# Patient Record
Sex: Female | Born: 1935 | Race: Black or African American
Health system: Western US, Academic
[De-identification: ages and names within clinical notes are randomized; demographics above are authoritative.]

## PROBLEM LIST (undated history)

## (undated) DIAGNOSIS — I10 Essential (primary) hypertension: Secondary | ICD-10-CM

## (undated) DIAGNOSIS — M069 Rheumatoid arthritis, unspecified: Secondary | ICD-10-CM

## (undated) DIAGNOSIS — N289 Disorder of kidney and ureter, unspecified: Secondary | ICD-10-CM

## (undated) DIAGNOSIS — J45909 Unspecified asthma, uncomplicated: Secondary | ICD-10-CM

## (undated) HISTORY — DX: Unspecified asthma, uncomplicated: J45.909

## (undated) HISTORY — PX: TRANSPLANT, KIDNEY: SHX900003

## (undated) HISTORY — DX: Rheumatoid arthritis, unspecified: M06.9

## (undated) HISTORY — DX: Essential (primary) hypertension: I10

## (undated) HISTORY — DX: Disorder of kidney and ureter, unspecified: N28.9

---

## 2002-07-11 ENCOUNTER — Ambulatory Visit

## 2002-07-14 ENCOUNTER — Other Ambulatory Visit: Payer: Self-pay | Admitting: GASTROENTEROLOGY

## 2002-07-14 ENCOUNTER — Encounter: Admitting: GASTROENTEROLOGY

## 2004-09-16 ENCOUNTER — Ambulatory Visit

## 2004-09-17 ENCOUNTER — Encounter: Admitting: Nephrology

## 2004-10-29 ENCOUNTER — Other Ambulatory Visit: Payer: Self-pay | Admitting: Nephrology

## 2004-10-29 ENCOUNTER — Encounter

## 2004-11-05 NOTE — Progress Notes (Addendum)
October 29, 2004 RE: Cathy Hunter    MR#: [id number]    DOB: Jun 05, 1925    Date of Service: 10/29/2004       Lonell Face, MD  [facility]  [address]  [place], [place] [address]        Dear Dr. Hilda Lias:    Thank you for this kind referral to the [facility].    We had the pleasure of seeing your patient, Cathy Hunter, in new consultation for renal transplant.    Her past medical history is as follows:    1. End-stage renal disease secondary to diabetes. The patient currently dialyzes Tuesday, Thursday and Saturday for 3-1/2 hours. She has residual urine output of about 500 cc per day. The patient has had no previous transplants or transfusions. She's G6, P6.    2. Diabetes, type 2. This was diagnosed in 2000 and is diet-controlled.    3. Hypertension for the last 15 years.    4. The patient was diagnosed as having a pancreatic mass at [facility] in 2002. It was felt at that time that the patient had pancreatic cancer. She was very ill at that time and had lost nearly 100 pounds of weight. It was felt she might not survive. She was, however, transferred to [facility] and it was determined that her pancreatic mass was most likely benign and that she had a very good prognosis for survival. A biopsy was subsequently taken of the sonolucent lesion in the head of the pancreas, and it was found not to have any atypical cells.    Past surgical history:    1. Left arteriovenous fistula.    2. Total abdominal hysterectomy.    3. Right total-hip replacement.    4. Open cholecystectomy.    5. Right-knee arthroscopy.    6. Right-shoulder arthroscopy.    7. Wrist surgery with pinning, left wrist.      The patient lives alone in [place]. 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 She has a previous history of smoking but quit 30 years ago. She also quit drinking 30 years  ago. The patient has no history of illicit drug use.    Family history is significant for brother who was diagnosed with renal failure in his 30s and died secondary to complications of that. She also had a son with glomerulonephritis and was status post kidney transplant times two. He died in a car accident not too long ago.    She has allergies to CODEINE, PENICILLIN and TALWIN. Her medications are not known.    On review of systems, the patient says she keeps very active. She drives to her dialysis sessions and does all her activities of daily living. The patient attends an exercise class where she stretches and does something similar to yoga. The patient walks a few blocks every day. She says she has an excellent energy level. She has a very good appetite. Her sleep is restful. She has no chest pain and no orthopnea. No palpitations and no lightheadedness. No shortness of breath, no hemoptysis, no melena and no bright red blood per rectum. She has no history of urinary tract infections. There is no history of stroke, seizures or loss of consciousness. The patient has had chickenpox in the past but has not had shingles.    Physical examination  reveals her BMI is 30.5, weight 170, height 62.5 inches, blood pressure 152/79 and heart rate 85. The patient has bilateral hearing aids in place. She has no carotid bruits. Her breasts are without masses. Lungs are clear to auscultation. Heart has a regular rate and rhythm. She has a large abdominal scar. Her pulses are +2 bilaterally, femoral, popliteal and dorsalis pedis. The patient has numerous moles that are papular on her back and on her stomach. She has a left upper extremity fistula in place.    Studies: She is blood type A positive. Her PRA was zero percent in August 2005. An esophagogastroduodenoscopy in September 2004 was normal. This was done to work up her nausea and vomiting. A chest x-ray in March 2006 was negative. Electrocardiogram showed normal sinus  rhythm, left ventricular hypertrophy, normal P axis. Persantine thallium in December 2005 with no ischemia and an ejection fraction of 63%. A mammogram done in March 2006 was benign. Colonoscopy has not yet been done; the patient was previously very scared and hesitant to have this done due to the severe complications she had in 2004 secondary to a barium swallow done to evaluate her nausea and vomiting. Abdominal ultrasound done in July 2004, as previously mentioned, showed a 2.5-cm sonolucent lesion in the head of the pancreas. Biopsy was done for that and showed no atypical cells.    Assessment and plan: This is a 71 year old female who has end-stage renal disease secondary to diabetes who overall would be an appropriate candidate for renal transplant. Despite her advanced age, she is a very lively and energetic person with few overall comorbidities.    The patient will need to have the following workup done to complete her file:  1.colonoscopy/ flexible sigmoidoscopy  2.echocardiogram.    The Transplant Selection Committee will be meeting next week to make final recommendations. If they should differ from those mentioned above, the patient's renal case manager will be contacted.    Again, thank you for this kind referral to the [facility]. If you have any additional questions, please do not hesitate to call.    Sincerely yours,      Pryor Montes, MD  ASSISTANT PROFESSOR  DIVISION OF NEPHROLOGY  DEPARTMENT OF INTERNAL MEDICINE  THIS WAS ELECTRONICALLY SIGNED - 11/05/2004 12:31 PM PST BY:     HQ:[id number]    D: 10/29/2004 06:17 PM  T: 10/30/2004 11:26 AM  C#: [id number]

## 2004-12-18 NOTE — Consults (Addendum)
PATIENT: Cathy, Hunter LOCATIONJules Schick   MR #: [id number] SEX: F AGE: 71  DATE OF CONSULTATION: 10/29/2004 DOB: 1925/03/30    TRANSPLANT CENTER PSYCHOSOCIAL EVALUATION     IDENTIFICATION AND REFERRAL STATEMENT:    Cathy Hunter is a 71 year old African American female with end-stage renal disease being evaluated for a renal transplant and referred for psychosocial evaluation by Pryor Montes, MD.    PSYCHOSOCIAL ASSESSMENT:    Cathy Hunter was born on [birth year] in [place], [place], where she was raised. She is the second of two children. Both her parents are now deceased. Her mother worked as a Designer, jewellery while she was growing up, and her father was the first Insurance account manager in [place]. She describes her relationship with both her mother and father as very good. Her education level is a Probation officer and she also is a Designer, jewellery. Of note, her brother died of renal failure, as well.    The patient is a widow. She was married for 23 years. Her husband died five years ago from a heart attack. She describes her marriage as good and that her husband was a good husband. She was previously married for 29 years, and her ex-husband is still alive and lives in [place]. She has six children, all of whom are involved in the patient's care except for one son who passed away from a car accident five years ago. Coincidentally he did have end-stage renal disease as well, and had a successful transplant.    The patient lives alone in a condominium in [place]. She has lived here for three years, moving from [facility] [place]. All of her children and grandchildren live out of the area; however, visit often and are very involved in her care. One daughter does live locally; however, she is moving to [place] very soon.    Currently the patient is very independent. She drives and meets all her own activities of daily living. She reports having a strong support  system including her family, who is very involved in her care, but also lots of friends who live within the condominium complex and from her church. The patient reports a very strong church involvement and feels that her faith is very helpful in her coping.    Cathy Hunter is retired. 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 She was a Designer, jewellery prior to that. Her financial situation is stable. She has her own pension and Tree surgeon, plus her husband's pension. She has [employer].    The patient is a nonsmoker. She denies any current or past issues with illicit drug use, alcohol use, or any issues with prescription pain medication. She did take Vicodin recently due to back pain from too much calcium for about three weeks.    The patient denies any current or past history with any psychiatric issues. She has never taken medication or received treatment for anxiety, depression, or any other mental health issues. She is not aware of any family history of mental health problems. She describes her coping strategies as keeping a positive attitude, talking with family and friends, and her faith all play a role in her coping abilities.    Cathy Hunter presented today with a casual and well-groomed appearance to this evaluation. Her attitude toward the interviewer was frank and attentive. Psychomotor activity was unremarkable. Her mood was  pleasant and her affect broad. Her speech was spontaneous. Thought process, thought content and judgment all appear to be within normal limits. She appears to have a great understanding about her illness and about transplantation.    RECOMMENDATION:    Cathy Hunter appears to be an acceptable candidate for kidney transplantation. In spite of living alone, she has a very strong support system. She has no history of substance use or mental health issues. She appears to be  demonstrating very good compliance with her dialysis.      THIS WAS ELECTRONICALLY SIGNED - 12/18/2004 11:34 AM PST BY: Shella Spearing, LCSW  LICENSED CLINICAL SOCIAL WORKER    ZOX:[id number])  D: 12/16/2004 09:25 AM  T: 12/17/2004 02:27 PM  C#: [id number]    cc:   Pryor Montes, MD  Division of Transplant Nephrology  [facility]

## 2005-08-31 ENCOUNTER — Inpatient Hospital Stay: Admission: RE | Admit: 2005-08-31 | Attending: Transplant Surgery | Admitting: Transplant Surgery

## 2005-09-01 NOTE — Consults (Signed)
PATIENT: Cathy Hunter, Cathy Hunter LOCATION:   MR #: [id number] SEX: F AGE: 72  DATE OF SERVICE: 09/01/2005 DOB: [birth year]    TRANSPLANT NEPHROLOGY INITIAL CONSULTATION     Patient was admitted on August 27th and was seen at the request of Dr. Rise Patience of the Transplant Surgery Service for assessment of suitability for deceased donor renal transplant and immunosuppression status post renal transplant. She is a 72 year old African-American female with stage V chronic kidney disease thought due to diabetic nephropathy admitted in anticipation of a deceased donor renal transplant. She is CMV seropositiveand has a 0% panel-reactive Ab.    PAST MEDICAL HISTORY:    1. Stage V chronic kidney disease on maintenance hemodialysis, Monday/Wednesday/Friday for 3 hours, with estimated dry weight 67 kg and 2 to 3 kg intradialytic weight gains. She produces approximately 700 mL of urine per day as measured by 24-hour collection. 2. Adult onset diabetes diagnosed in 2000 and is diet-controlled with blood sugars in the 80 to 100 range.  3. Hypertension for 15 years.  4. Status post placement of right forearm arteriovenous graft. 5. History of benign pancreatic mass in 2002 which has been stable and asymptomatic since.  6. Status post placement of left arteriovenous fistula which did not develop.  7. Status post total abdominal hysterectomy  8. Status post right total hip replacement.  9. Status post open cholecystectomy.  10. Status post right knee arthroscopy.  11. Status post right shoulder arthroscopy.  12. Status post left wrist surgery with pinning.  13. Clostridium difficile pseudomembranous colitis approximately 6 months ago due to antibiotic therapy.    ALLERGIES: MEDICATIONS: SHE IS ALLERGIC TO CODEINE, PENICILLIN AND TALWIN. Medications on Admission: Folic acid 1 mg daily, lovastatin 10 mg daily, aspirin 81 mg daily.    FAMILY HISTORY:    Brother died in his 33s with chronic kidney disease. A son with glomerulonephritis received  kidney transplant.    SOCIAL HISTORY:    She lives alone. She denies tobacco, alcohol, recreational or drug use. She did smoke 30 years ago.    REVIEW OF SYSTEMS:    Constitutional: No chronic fevers, chills or sweats. No malaise. No anorexia. Good energy level. She drives to dialysis sessions and attends regular exercise classes. She walks a few blocks every day. Eyes: Stable vision with correction. ENT: No sinus, ear or dental complaints. Pulmonary: No chronic cough, wheezing, shortness of breath, or sputum productions. Cardiovascular: No chest pain, palpitations, PND, orthopnea, or edema. GI: No chronic nausea, vomiting, diarrhea, hematemesis, melena or hematochezia. GU: No urinary urgency, frequency, dysuria, gross hematuria, urinary tract infection or nephrolithiasis. Skin: No rash or itching. Neurologic: No strokes, seizure, dizziness, lightheadedness, loss of consciousness, numbness or weakness.    PHYSICAL EXAMINATION:    GENERAL: Reveals an elderly female looking younger than stated age, lying comfortably in bed.    WEIGHT: 65.1 kg.    VITAL SIGNS: Temperature 36.4, blood pressure 170/58, pulse 73. O2 sat 100% on room air.    HEENT: Pupils equal, round, reactive to light. Intact extraocular movements. Sclerae anicteric. Oropharynx is clear with moist mucous membranes.    NECK: No lymphadenopathy. No jugular venous distention; 2/2 carotid pulses without bruits. No thyromegaly or nodules.    LUNGS: Clear to auscultation.    HEART: Regular rhythm, S1, S2, no S3 or S4, murmur, rub or gallop.    ABDOMEN: Soft, nontender, nondistended, no mass, no organomegaly, no bruit. Normal bowel sounds. Well-healed midline incision and right subcostal incision.  EXTREMITIES: No peripheral edema, 2/2 dorsalis pedis and femoral pulses bilaterally. Right arm: Arteriovenous graft intact with thrill and bruit.    SKIN: Xerosis. No rash, petechiae or purpura.    NEUROLOGIC: Cranial nerves II through XI intact. Motor strength 5/5  throughout. Sensory intact to light touch; no tremor.    LABORATORY DATA:    EKG: Normal sinus rhythm with LVH. BUN and creatinine 25 and 6.9. Potassium 4, bicarbonate 28, phosphorous 4.8, albumin 3.5. Liver function tests within normal limits. White count 4.7, hemoglobin 15.1, platelets 158,000.    Chest x-ray: No acute disease. Persantine thallium study December 2005, LVEF 63%, no ischemia. Mammogram March 2006 was benign.    IMPRESSION:    This is a 72 year old female with stage V CKD due to presumed diabetic nephropathy who has no contraindication to transplant. She was dialyzed yesterday and is euvolemic with appropriate electrolytes. Recommend:    1. Consider decreased induction immunosuppression with Zenapax 2 mg/kg IV and initiation of tacrolimus and CellCept. 2. Infectious disease prophylaxis with IV ganciclovir followed by valganciclovir dose suggested for renal function as soon as she is adequately taking POs. Continue Bactrim for Pneumocystis pneumonia prophylaxis.  3. No indication for preoperative dialysis.  4. She may require insulin therapy after transplant.              THIS WAS ELECTRONICALLY SIGNED - 09/01/2005 7:21 PM PST BY: Sabino Gasser, MD, PHD  ASSOCIATE PROFESSOR  DIVISION OF NEPHROLOGY & TRANSPLANT  DEPARTMENT OF INTERNAL MEDICINE      ZOX:[id number])    D: 09/01/2005 11:23 AM  T: 09/01/2005 05:18 PM  C#: [id number]

## 2005-09-01 NOTE — H&P (Addendum)
PATIENT: Cathy Hunter, BOONSTRA LOCATION:   MR #: [id number] SEX: F AGE: 72  ADMISSION DATE: 08/31/2005 DOB: Jan 11, 1925    INPATIENT HISTORY AND PHYSICAL    CHIEF COMPLAINT: Kidney failure.    HISTORY OF PRESENT ILLNESS:    [name] is a 72 year old woman who first presented for consultation for renal transplant in October of 2006. She has end-stage renal disease secondary to diabetes and has been receiving hemodialysis on Tuesday, Thursday and Saturday for approximately five years. She has residual urine output of about 500 cc per day, and she has no previous transplants or transfusions. She was initially evaluated by our Transplant Nephrology Team on October 29, 2004, and since then she reports that she has had no changes in her health. Upon presentation today, she denies any pain. Today, she returned from a vacation with her daughter, went immediately to hemodialysis upon arriving home, and then was notified of the potential for renal transplant this evening and was admitted to the hospital. This patient has been found to be HIV negative, hep B negative, CMV positive, RPR negative, VZV positive, EBV positive, hep C negative.    PAST MEDICAL HISTORY:    1) End-stage renal disease secondary to diabetes, presently dialyzed through a right upper extremity graft on Tuesday, Thursday, and Saturday. 2) Diabetes type 2, diet controlled. 3) Hypertension. 4) Benign pancreatic mass. PAST SURGICAL HISTORY: Left AV fistula, total abdominal hysterectomy, right total hip replacement, open cholecystectomy, right knee arthroscopy, right shoulder arthroscopy, left wrist surgery with pinning, and right upper extremity vascular graft. ALLERGIES: PENICILLIN, CODEINE, PENTAZOCINE, IODINE, AZITHROMYCIN, SULFUR, VICODIN. Current Home Medications: Clonazepam 0.5 mg 30 minutes prior to hemodialysis, Fosrenol 1000 mg p.o. t.i.d. with meals and 500 mg with snacks, oxazepam one tab before hemodialysis for cramps, metoprolol 25 mg b.i.d., fluoxetine  10 mg daily, Sensipar 30 mg daily with dinner, lovastatin 20 mg daily, lisinopril 10 mg daily.    FAMILY HISTORY:    Notable for kidney failure in a brother and glomerulonephritis in a son, who later passed away in a car accident.    SOCIAL HISTORY:    She quit smoking approximately 50 years ago. Prior to that she smoked for approximately 15 years. She smoked approximately one pack per week. She denies any alcohol use or other drug use. She currently lives alone in [place] and she is a very independent woman. She does have her daughter in the area for support.    REVIEW OF SYSTEMS:    An 11-point review of systems is negative for fevers, pain, nausea, vomiting, diarrhea, dizziness, weakness, and shortness of breath.    PHYSICAL EXAMINATION:    VITALS: Heart rate 73, blood pressure 170/58, respirations 20, saturations 100% on room air.    IN GENERAL: This is a well-appearing and very affable older woman in no acute distress.    HEENT: Normocephalic, atraumatic. Mucous membranes moist. Oropharynx clear without erythema or exudate.    NECK: Supple, no lymphadenopathy.    PULMONARY: Clear to auscultation bilaterally.    CARDIOVASCULAR: Regular rate and rhythm. Peripheral pulses equal and intact bilaterally.    ABDOMEN: Well-healed midline scar in her hypogastrium. Well-healed Kocher incision in the right subcostal area. Soft, nontender, nondistended. Normal bowel sounds. No hepatosplenomegaly.    EXTREMITIES: Full range of motion in all four extremities. No clubbing, cyanosis or edema. RUE AVF intact with good thrill. Evidence of old fistula on LUE.    NEURO: Alert and oriented times four, appropriate affect.  STUDIES:    A twelve lead EKG shows normal sinus rhythm with left ventricular hypertrophy and slightly prolonged QT.    Labs today show a potassium of 4.0, creatinine of 6.9 post hemodialysis, glucose 78.    LFTs within normal limits.    PTT 27.7, INR 1.09.    White blood count 4.7, hemoglobin 15.1, hematocrit  46.2.    UA and chest x-ray are pending at this time.    IMPRESSION:    [name] is a 72 year old woman with end-stage renal disease secondary to diabetes, now on hemodialysis for approximately five years. She presents today for deceased donor renal transplant and will undergo preoperative evaluation to include labs, EKG, and chest x-ray before proceeding to the OR this evening for transplant.    INFORMED CONSENT:    She has been consented for surgery and all her questions have been answered. She is NPO currently and we are waiting final results of her preoperative evaluation.          THIS WAS ELECTRONICALLY SIGNED - 09/10/2005 5:50 PM PST BY: Vernice Jefferson, MD    THIS WAS ELECTRONICALLY SIGNED - 09/01/2005 8:21 PM PST BY: Boykin Peek, MD  RESIDENT  DEPARTMENT OF SURGERY      BM:[id number])    D: 08/31/2005 09:42 PM  T: 09/01/2005 03:45 AM  C#: [id number]

## 2005-09-02 NOTE — Progress Notes (Signed)
PATIENT: Cathy Hunter, Cathy Hunter LOCATION: [id number]  MR #: [id number] SEX: F AGE: 72  DATE OF SERVICE: 09/02/2005 DOB: 08-04-1925    INTERNAL MEDICINE INPATIENT PROGRESS NOTE    Patient was seen with nephrology fellow, Dr. Mercy Moore. I repeated the full physical exam and key parts and verified the history. I agreed with his assessment and together we developed a care plan. This morning, Cathy Hunter is in very jovial spirits. She denies any shortness of breath. She denies any chest discomfort, any nausea or vomiting. Patient has not passed gas yet. She has slight discomfort in her abdomen secondary to this. The rest of her review of systems are negative.  PHYSICAL EXAMINATION: She has had a total of six liters of fluid in with 600 cc of urine output. Her blood pressure has remained in the 150 to 170 range over 60 to 70, heart rate in the 80s. Temperature: T max was 98.4. Her blood glucose level this morning was 71. She is an elderly African-American female who looks to be in no acute distress. She is nontoxic looking. Her pupils are equal, round, and reactive to light. They are nonicteric. Her lungs are clear to auscultation. Her heart is regular rate and rhythm. Her abdomen is soft. Slight pain at the incision site. She has 1+ lower extremity edema. She has bilateral hearing aids in place.  MEDICATIONS: Currently taking lanthanum 500 mg t.i.d., ganciclovir 150 mg q. day, lovastatin 20 mg q. day, amlodipine 5 mg q. day, metoprolol 25 mg b.i.d., dapsone 100 mg q. day, CellCept 500 mg b.i.d., famotidine 20 mg b.i.d., Solu-Medrol 125 mg, and gammaglobulin 75 mg.  LABS: Creatinine 6.6, BUN 39, potassium 4.8, white count 7.2, hemoglobin 10.6, platelet count 150, phos 7.8, calcium 6.6. IMPRESSION AND PLAN: 1. This is a 72 year old female who was status post a deceased donor renal transplant secondary to endstage renal disease from diabetic nephropathy. Patient has slow graft function likely secondary to that prolonged cold ischemia time  of approximately 25 hours. Her urine output has significantly increased today, and I think we will start to see a reduction in her creatinine for the next day or two. The patient has no overt uremic symptoms, and there is no urgency for hemodialysis for electrolytes or volume status. 2. Immunosuppression. Patient received induction therapy with two doses of Thymoglobulin and SoluMedrol. Patient should continue on CellCept 500 mg and depending on her serum creatinine tomorrow will determine when her Prograf dose should be started.  3. ID prophylaxis. She should continue on ganciclovir IV while Thymoglobulin is being given today, but the dose should be reduced to approximately 1.25 mg/kg, so a total dose of about 80 mg IV today. When she has completed her Thymoglobulin, she can be started on Valcyte 450 mg, which should be renally dosed to be given every 48 hours.  4. Hypertension. Patient's blood pressure is pretty elevated. I would increase her metoprolol 50 mg b.i.d., and I agree with adding Norvasc 5 mg which may need to be escalated.        THIS WAS ELECTRONICALLY SIGNED - 09/05/2005 11:46 PM PST BY: Pryor Montes, MD  ASSISTANT PROFESSOR  DIVISION OF NEPHROLOGY  DEPARTMENT OF INTERNAL MEDICINE                  AV:[id number]    D: 09/02/2005 07:52 PM  T: 09/02/2005 08:00 PM  C#: [id number]

## 2005-09-03 NOTE — Procedures (Signed)
PATIENT: Cathy Hunter, Cathy Hunter LOCATION: [facility]  MR #: [id number] SEX: F AGE: 72  OPERATION DATE: 09/01/2005 DOB: 03-28-1925    INPATIENT OPERATION RECORD    PREOPERATIVE DIAGNOSIS:     End-stage renal disease.    POSTOPERATIVE DIAGNOSIS:     Same.    OPERATIVE PROCEDURE:    1. Deceased donor renal transplant.  2. Back table preparation of renal allograft.  3. Back table venous anastomosis of renal allograft. 4. Back table arterial anastomosis of renal allograft. 5. Insertion of double-J ureteral stent.    CASE HISTORY:    This 72 year old female was admitted for deceased donor renal transplant. There were no medical contraindications for transplantation. The donor was a 72 year old diabetic who had received a living donor renal transplant eight years prior and died of subarachnoid hemorrhage. The transplanted allograft was procured en bloc and preserved with pulsatile preservation. Renal biopsies showed intact glomeruli with no evidence of chronic glomerulopathy or fibrosis. Pump characteristics showed flows of over 90 and resistive index of 0.24.    DETAILS OF PROCEDURE:     After induction of general anesthesia, the ABO blood type verification was confirmed. The Foley catheter was placed and a central line was placed by Anesthesiology. During this time, the back table preparation was performed.     The kidney was taken from off the pulsatile preservation and placed on ice. Nephric fat was excised. There was a laceration in the inferior pole of the kidney, which was closed with 5-0 Prolene and Dacron pledgets to oppose the parenchyma. The kidney had been procured intact with iliac artery and vein from the donor. The previously performed renal artery and renal vein anastomosis to the iliac vessels were intact.    The vessels were dissected free in order to lengthen the functional renal veins. Vascular staples were fired transversely across the iliac vein, right proximal and distal to the renal vein anastomosis. The new  venotomy was created in the iliac vein and served as the functional venous outflow to the allograft. There were multiple defects in the vein that were closed with 6-0 Prolene. The renal allograft artery was, likely, dissected free, and the arterial anastomosis to the iliac artery was intact.    The functional renal artery was lengthened by placing a staple across the distal end of the iliac artery. The proximal iliac artery was then used for the arterial inflow to the allograft. A kidney had been procured with a small patch of bladder, which included ureteroneocystostomy. The ureter was trimmed and the bladder patch was excised immediately proximal to the ureteral anastomosis.    The vessels were then checked for leaks and were found to be intact. The kidney was placed back in ice and our attention was then turned to the patient.    After appropriate surgical pause, and confirmation of consent and site of surgery, a left lower quadrant incision was made. The abdominal musculatures were divided, as were the round ligament and the inferior epigastric vessels. The retroperitoneal space was entered and common iliac artery and vein were dissected free.     The venous anastomosis was performed first to the external iliac vein in the side. Arterial anastomosis was then performed utilizing the previously mentioned donor arterial iliac artery, which was anastomosed end-to-side to the recipient external iliac artery. Clamps were released and the kidney pinked up gradually over a period of five minutes. Crossclamp time was 08/27, at 0717. The anastomosis began at 08/28, at 0824. Reperfusion was at 08/28,  at 0910.    Hemostasis was achieved with the cautery, and after approximately about 10 minutes there was a small amount of urine that was produced by the transplanted kidney.     The ureteral anastomosis was then performed via the standard  technique. A 6 x 16 double-J ureteral stent was placed across the ureteral  anastomosis, up into the renal pelvis and into the bladder. After the mucosal anastomosis was complete, the detrusor was closed over the tip of the ureter with a 3-0 Maxon suture.     A time-zero renal allograft biopsy was obtained with the Tru-Cut needle. Hemostasis was again confirmed, and the incision was closed with a running #1 PDS, followed by skin staples.    The patient was awakened and transported to the Recovery Room with the T-piece. There are no complications encountered. All sponge, needle and instrument counts were correct.    IV FLUIDS: 3800 cc crystalloid.  URINE OUTPUT: 70 cc.  ESTIMATED BLOOD LOSS: 300 cc.          THIS WAS ELECTRONICALLY SIGNED - 09/10/2005 5:49 PM PST BY: Vernice Jefferson, MD  SURGEON  DEPARTMENT OF SURGERY    JESSICA EMELIN, MD  ASSISTANT Erline Levine , MD  ASSISTANT SURGEON     ZOX:[id number])    D: 09/03/2005 02:24 AM  T: 09/03/2005 10:35 AM  C#: [id number]

## 2005-09-03 NOTE — Progress Notes (Signed)
PATIENT: Cathy Hunter, ZEE LOCATION: [facility]  MR #: [id number] SEX: F AGE: 72  DATE OF SERVICE: 09/03/2005 DOB: [birth year]    INTERNAL MEDICINE INPATIENT PROGRESS NOTE    Ms. Ayani Ospina was seen today with nephrology fellow, Dr. Mercy Moore. I repeated the full physical exam and verified the review of systems. I agree with his assessment, and together we developed the care plan. This morning, Ms. Baley has no complaints. She denies any shortness of breath, any abdominal pain; any fevers, chills, nausea, or vomiting. There rest of her review of systems is negative.  PHYSICAL EXAMINATION: Blood pressure has been 160 to 170 over 40 to 60, heart rate 70 to 80. CVP is 14. Is and Os: 6.2 L in and 2.8 L out. She is an African-American female, looks to be in no acute distress, just mild tenderness over the incision site with trace lower extremity edema. Otherwise, her physical exam is unchanged. LABS: Creatinine is 4 down from 6.6, potassium 5.2, bicarb 20. White count 6.6, hemoglobin 10.7, platelet count 125. Prograf trough is 2.0 ng/mL.  IMPRESSION AND PLAN: Ms. Wanetta Funderburke is a 72 year old female who is status post deceased donor renal transplant on 08/28, she is postop day #2 today, who has slow graft function secondary to prolonged cold ischemia time with slowly improving creatinine.  1. Immunosuppression. I would recommend discontinuing the Thymoglobulin, given her advanced age and risk for adverse reactions, and starting Prograf today of 2 mg b.i.d. and continuing CellCept. 2. ID/prophylaxis. If Thymoglobulin is discontinued, they can initiate Valcyte 450 mg which should be dosed every other day and dapsone for PCP prophylaxis.  3. Volume status. Patient has an elevated CVP with slight bibasilar crackles. I would recommend hep-locking additional fluids today. 4. Hypertension. Blood pressure is still elevated. Part of this may be volume mediated. Recommend increasing her Norvasc to 5 mg in am and 10mg  in pm.          THIS WAS  ELECTRONICALLY SIGNED - 09/06/2005 12:05 AM PST BY: Pryor Montes, MD  ASSISTANT PROFESSOR  DIVISION OF NEPHROLOGY  DEPARTMENT OF INTERNAL MEDICINE                  IO:[id number])    D: 09/03/2005 05:11 PM  T: 09/03/2005 05:20 PM  C#: [id number]

## 2005-09-04 NOTE — Progress Notes (Signed)
PATIENT: Cathy Hunter, Cathy Hunter LOCATION: [facility]  MR #: [id number] SEX: F AGE: 72  DATE OF SERVICE: 09/04/2005 DOB: [birth year]    INTERNAL MEDICINE INPATIENT PROGRESS NOTE    The patient, Cathy Hunter, was seen with the nephrology fellow, Dr. Mercy Moore. I repeated the full physical exam and verified the history and agree with the assessment. Together we developed a care plan. This morning Cathy Hunter has no complaints of shortness of breath, chest pain, abdominal discomfort, diarrhea, tremors. The rest of her review of systems is negative. When asked, however, this morning about her blood pressure medications at home, the patient says that she did not know and asked [place] to call the pharmacy at [facility]. We made numerous attempts to do so. The only medications that were listed as being taken were metoprolol 25 mg b.i.d.  PHYSICAL EXAM: Blood pressure 170 to 180 systolic over 70 to 80, heart rate between 80 to 110, T-max is 97.7. Had total of 1.8 L of fluid in, 2.1 L of urine output. A fasting blood sugar this morning was 133. She is an [place] American female who looks to be in no acute distress. Her physical exam is essentially unchanged from previously. Please see Dr. Karenann Cai notes for details. LABS: Creatinine 1.7, BUN 37 (that is down from 39 and 4.0), potassium 4.8. Phosphorus 3.4, magnesium 3.0. White count 5.7, hemoglobin 10.3, platelet count 151.  IMPRESSION AND PLAN: Cathy Hunter is a 72 year old female who is status post a deceased-donor renal transplant on September 01, 2005, which was five-antigen mismatch. Her allograft function has slowly, but steadily been improving.  1. Immunosuppression. She is status post induction therapy with two doses of Thymoglobulin, 100 mg and 75 mg IV, and Solu-Medrol. She should be maintained on CellCept 500 mg b.i.d. and Prograf to maintain a trough around 8 to 10 ng/mL.  2. Hypertension. Her blood pressure is quite elevated. I would recommend the following changes. I would recommend  discontinuing metoprolol and starting her on labetalol 300 mg b.i.d. I would continue the amlodipine that she is currently on as well. 3. Electrolytes. These are currently stable.  4. ID prophylaxis. Patient is at intermediate risk for developing CMV disease. She should be maintained on Valcyte 450 mg on Monday, Wednesday, and Friday given that her creatinine clearance is still around 25 mL/min. She should continue Dapsone for PCP prophylaxis. 5. Electrolytes. The patient's phosphorus level now is in the normal range. We can stop her Fosrenol and her PhosLo. Magnesium level is currently stable.        THIS WAS ELECTRONICALLY SIGNED - 09/06/2005 12:24 AM PST BY: Pryor Montes, MD  ASSISTANT PROFESSOR  DIVISION OF NEPHROLOGY  DEPARTMENT OF INTERNAL MEDICINE                  AV:[id number])    D: 09/04/2005 02:22 PM  T: 09/04/2005 03:45 PM  C#: [id number]

## 2005-09-05 NOTE — Progress Notes (Signed)
PATIENT: Cathy Hunter, BERREY LOCATION: [facility]  MR #: [id number] SEX: F AGE: 72  DATE OF SERVICE: 09/05/2005 DOB: [birth year]    INTERNAL MEDICINE INPATIENT PROGRESS NOTE    Ms. Shavonte Zhao was seen with the nephrology fellow, Sonda Rumble. I repeated the full physical exam, verified the history and I agree with his assessment. Together we developed a care plan. This morning, Ms. Parker has no complaints. She denies any shortness of breath, any abdominal pain, any difficulty swallowing, any diarrhea, or any tremors. She is noticing she is having slight leakage from her incision site. The rest of her review of systems is negative. PHYSICAL EXAM: Blood pressures have been better controlled in the 130 to 150 range over 60 to 70. Heart rate is in the 80s. Her T-max was 98.2 this morning. Her fasting blood sugar this morning was 103. She had a total of 1.8 L in and 1.1 L of urine output. Please see Dr. Collier Flowers note for details of her physical exam, but her exam has not changed. She is without edema, just very mild leakage of yellowish fluid from her incision site.  LABS: Creatinine is 1.8, down from 1.9, BUN is 43, her potassium is 3.9. White count 4.8, hemoglobin 9.4, platelet count 147, phos 3.3, mag is pending. Albumin 2.4.  IMPRESSION AND PLAN: 1. Ms. Amarya Kuehl is a very spry 72 year old who is postop day #4 from a deceased donor renal transplant. Her renal function is improving; however, the drop in her creatinine has slowed down.  2. Immunosuppression. Continue dual therapy with Prograf and CellCept. Her Prograf levels were low yesterday after two doses, and we will wait to see her Prograf trough this afternoon. I assume, however, it will still be low. Would recommend increasing her Prograf to 4 mg b.i.d. She should continue taking CellCept 500 mg b.i.d. 3. ID prophylaxis. Continue Valcyte Monday, Wednesday, Friday for the time being, given her suppressed renal function. If her creatinine drops more, she can be changed to Valcyte  450 mg daily. 4. Hypertension. Her blood pressure is better controlled on the combination of Norvasc and labetalol. If her systolic blood pressures remain persistently above 150 to 160, can titrate up labetalol to her heart rate.        THIS WAS ELECTRONICALLY SIGNED - 09/06/2005 12:31 AM PST BY: Pryor Montes, MD  ASSISTANT PROFESSOR  DIVISION OF NEPHROLOGY  DEPARTMENT OF INTERNAL MEDICINE                  MW:[id number])    D: 09/05/2005 10:18 A  T: 09/05/2005 10:27 A  C#: [id number]

## 2005-09-06 NOTE — Progress Notes (Signed)
PATIENT: Cathy Hunter, Cathy Hunter LOCATION: [facility]  MR #: [id number] SEX: F AGE: 72  DATE OF SERVICE: 09/06/2005 DOB: 1925-04-12    INTERNAL MEDICINE INPATIENT PROGRESS NOTE    I have repeated the full physical exam, and have verified his history, and I agree with his assessment. Together we developed a care plan. This morning Ms. Cathy Hunter has no complaints. She is tolerating her food fine. She has no nausea, no vomiting, no diarrhea. Patient reports that family members again cannot come today and will not be able to come until Tuesday. All of her family is in [facility] [place], and her Lobbyist is off for the weekend. Patient seems to think that she needs 24-hour, around-the-clock help before she can leave the hospital. Patient has also not made attempts to learn her medications. The rest of the review of systems is negative. PHYSICAL EXAMINATION: Blood pressure between 130 to 160 over 60 to 70, heart rate 74 to 88, T-max 97.6. Fasting blood sugar is 88. A total of 2 liters in and 800 cc of urine output. She is an [place]- American female. She looks to be in no acute distress. Heart is regular rate and rhythm. She has intermittent inspiratory wheezing. Her abdomen is soft; it is nontender, nondistended. She has 1-2+ edema in her lower extremities. Her neuro exam was nonfocal. MEDICATIONS: 1) CellCept 500 mg b.i.d. 2) Prograf 5 mg b.i.d. 3) Valcyte 450 mg Monday, Wednesday, Friday. 4) Colace 100 mg q. day. 5) Labetalol 300 mg b.i.d. 6) Tapasin 100 mg daily. 7) Norvasc 5 mcg in the morning, 10 in the evening. 8) Lovastatin. 9) Aspirin 81 mg. LABS: Creatinine 1.7, down from 1.8. Potassium 4.3, bicarb 21, mag 2.4, phos 3.0. White count 4.7, hemoglobin 9.4, platelet count 191. A fasting blood sugar was 88. Creatinine from the wound 1.89 mg/dL.    IMPRESSION AND PLAN: Ms. Cathy Hunter is a 72 year old female who is postop day number 5 from a deceased donor renal transplant with stable allograft function.  1.  Immunosuppression. Continue dual therapy with CellCept and Prograf. The Prograf dose was increased to 5 mg b.i.d. 2. Hypertension. She still remains hypertensive. Will increase labetalol to 400 mg b.i.d. and continue Norvasc.  3. ID prophylaxis. Continue Valcyte. Could change this dosing to 450 mg daily given improvement in creatinine and dapsone for PCP prophylaxis. G6PD is pending.  4. Social support. Will need to ensure that patient's family or caregiver will be supportive and consistent enough to bring her to her lab appointments, and to her clinic appointments.        THIS WAS ELECTRONICALLY SIGNED - 09/08/2005 9:39 AM PST BY: Pryor Montes, MD  ASSISTANT PROFESSOR  DIVISION OF NEPHROLOGY  DEPARTMENT OF INTERNAL MEDICINE        WG:[id number])    D: 09/06/2005 10:12 AM  T: 09/06/2005 12:02 PM  C#: [id number]

## 2005-09-07 NOTE — Progress Notes (Signed)
PATIENT: Cathy Hunter, Cathy Hunter LOCATION: [facility]  MR #: [id number] SEX: F AGE: 72  DATE OF SERVICE: 09/07/2005 DOB: September 11, 1925    INTERNAL MEDICINE INPATIENT PROGRESS NOTE    Cathy Hunter was seen with the nephrology fellow, Teodoro Spray. I repeated the full physical exam, verified the history, and I agree with his assessment. Together we developed a care plan. This morning, Cathy Hunter just had some pain at the incision site. She is also having just mild shortness of breath. She denies any nausea, any vomiting. She denies any difficulty urinating. Her p.o. intake has been at a minimal. Patient said she would like to have a Pepsi this morning. Otherwise appetite is good and the rest of her review of systems is negative.  PHYSICAL EXAM: Blood pressure has ranged between 140 to 150 over 50 to 60, heart rate has been in the 70s to 80 range. She has been afebrile. Her blood sugar this morning was 107. She had a total of 1.9 liters in and at 1.1 liters of urine output. African American female. She looks to be in no acute distress. Pupils are equal, round, and reactive to light. She has no thrush. She has no oral lesions. Heart: Regular rate and rhythm. Abdomen: Soft, nontender, nondistended. She does have some diffuse expiratory wheezing and she is getting slightly short of breath with talking. She has trace to 1+ lower extremity edema. Neuro exam: Nonfocal.  LABORATORY: Creatinine is up at 2.2, BUN is 45, potassium 5.7, bicarb 21, white count 5.3, hemoglobin 9, platelet count 153, albumin 2.3, phos 3.4, mag 2.5.  IMPRESSION AND PLAN: 1. Cathy Hunter is a 72 year old female who is postop day number 6 from a deceased donor renal transplant. Previously stable allograft function. Patient has an increase in her creatinine to 2.2., which I do not think can be explained by poor p.o. intake alone; however, patient will be hydrated and her serum creatinine will be rechecked this afternoon. It is a possibility that this may  represent rejection if truely elevated. Will repeat CHEM 7 in pm.  2. Immunosuppression status post two doses of Thymoglobulin for induction, and will be maintained on Prograf and CellCept. Patient was previously on 4 mg b.i.d. The patient has had an escalating dose of tacrolimus and is currently on 6 mg b.i.d. Given her small frame, given her weight of 65 kilograms, this would be an excessive dose and will likely result in significantly overshooting her Prograf trough of 8 to 10 ng/mL. We will await Prograf trough this afternoon. Continue CellCept 500 mg b.i.d.  3. Electrolytes. Currently stable.  4. Hypertension. This is better controlled since adding the labetalol. If her blood pressures remain in the high 150s, I would recommend an increase of labetalol to 500 mg b.i.d. 5. ID prophylaxis. The patient's creatinine is truly elevated. We may want to place her back on a Monday, Wednesday, Friday schedule of Valcyte. If the creatinine comes back down we can continue daily Valcyte. Also continue dapsone. G6PD is currently pending.    Addendum: afternoon creatinine elevated to 2.6 mg/dl despite hydration. Will check [place] with doppler to check for patency of vessels and if normal, should consider biopsy in am.     THIS WAS ELECTRONICALLY SIGNED - 09/08/2005 9:44 AM PST BY: Pryor Montes, MD  ASSISTANT PROFESSOR  DIVISION OF NEPHROLOGY  DEPARTMENT OF INTERNAL MEDICINE                  AV:[id number])  D: 09/07/2005 10:40 AM  T: 09/07/2005 10:53 AM  C#: [id number]

## 2005-09-08 NOTE — Progress Notes (Signed)
 PATIENT: Cathy Hunter, ION LOCATION:   MR #: [id number] SEX: F AGE: 72  DATE OF SERVICE: 09/08/2005 DOB: [birth year]    INTERNAL MEDICINE INPATIENT PROGRESS NOTE    TRANSPLANT NEPHROLOGY PROGRESS NOTE  Patient is seen and examined by me this morning.  SYMPTOM REVIEW: Overall feels well. No fever. No chills. No shortness of breath, although did have a mild episode yesterday. No orthopnea or PND. Urinary catheter removed yesterday and is able to void on her own. Mild lower extremity edema. Does not complain of significant volume overload, although she is up approximately 10 kilos above her dry weight.  PHYSICAL EXAM: Elderly, African-American female, in no apparent distress. Temperature 97, pulse 68-73 per minute, blood pressure 118- 133 over 50-53, respiratory rate 18-20 per minute. Blood sugars 90- 160 mg/dL. In's and Out's: 1.2 liters in and 450 cc out. Neck: No obvious jugular venous pressure that I could appreciate. Chest: Clear to auscultation bilaterally. Cardiac exam is normal. Abdomen is obese. Kidney transplant in the left lower quadrant is nontender. Extremity exam very trace peripheral edema.  LABORATORY DATA: Potassium 4.5, CO2 20, BUN 51, creatinine 3.0, blood glucose 78 mg/dL, serum phosphorus 4.2. White count 4.7, hematocrit 27, platelet count 173. Tacrolimus level yesterday was 8.3. ASSESSMENT: 1. Acute on chronic renal failure. The patient's serum creatinine has increased on a daily basis from 1.8 to 1.9 mg/dL, to 2.5, and now to 3 mg/dL. I think this is most likely secondary to ATN, but we should do a kidney transplant biopsy to make sure she does not have rejection.  2. Patient informed of the above. Very anxious because her son also had a kidney transplant and I think lost the kidney as a result of biopsy complication.  3. Immunosuppression. On dural therapy with Prograf and CellCept. Would not increase Prograf further. Aim for level of 8-12 ng/mL. 4. No indication for dialysis.  5. Electrolytes are  stable.  6. Reduce Valcyte to 450 mg three times a week.  7. Moderate degree of anemia but stable.  8. Kidney function likely to get worse before it gets better. We will plan kidney biopsy today. Results, hopefully, will be out this evening or tomorrow morning.           THIS WAS ELECTRONICALLY SIGNED - 09/10/2005 2:02 PM PST BY:  Marlane Mingle, MD  ASSISTANT PROFESSOR  DIVISION OF NEPHROLOGY & TRANSPLANT  DEPARTMENT OF INTERNAL MEDICINE                  WJX:[id number])    D: 09/08/2005 10:01 AM  T: 09/08/2005 10:44 AM  C#: [id number]

## 2005-09-09 ENCOUNTER — Encounter: Admitting: Nephrology

## 2005-09-09 NOTE — Progress Notes (Signed)
PATIENT: Cathy Hunter, Cathy Hunter LOCATION: [facility]  MR #: [id number] SEX: F AGE: 72  DATE OF SERVICE: 09/09/2005 DOB: 01/19/1925    INTERNAL MEDICINE INPATIENT PROGRESS NOTE    TRANSPLANT NEPHROLOGY PROGRESS NOTE  Shrinika Blatz was seen and examined by me this morning. SYSTEM REVIEW: Underwent kidney biopsy yesterday. No significant discomfort at the biopsy site. No gross hematuria. Hematocrit has remained stable. Did have a mild flare of her asthma at night. She was prescribed inhalers. She is on low oxygen at night. SYMPTOM: No fever. No chills. Denies shortness of breath. Denies chest pain. No abdominal pain, nausea, or vomiting. No dysuria or hematuria, although urine output is decreased. Mild but stable lower extremity edema.  PHYSICAL EXAM: Temperature 98, heart rate 65-75 per minute, blood pressure 110/55 to 132/62, respiratory rate 18 per minute, O2 sat 96%. Neck: PICC. Jugular venous pressure cannot be appreciated. Chest: Relatively clear to auscultation except for mild expiratory wheezing. Abdomen is obese but soft. Kidney transplant in the left lower quadrant is nontender. Extremity exam: 1 to 2+ peripheral edema. Weight 75 kg, same as the last 2 days. Urine output 500 cc. LABORATORY DATA: Potassium 4.5, CO2 19, BUN 57, creatinine 3.5, blood glucose 89 mg/dL. Serum albumin 2.3. AST 13, ALT 11. White count 4, hematocrit 26.2, platelet count 189. Prograf level is pending. ASSESSMENT: 1. Acute-on-chronic renal insufficiency. We did a kidney biopsy yesterday and there is no evidence of acute rejection, although there is significant chronicity in the biopsy with approximately 40-50% tubular atrophy and interstitial fibrosis. Final report is pending.  2. Immunosuppression. On dual therapy with Prograf and CellCept. I would aim to keep the Prograf level between 7-10 ng/mL. 3. Hypertension. Very well controlled. Consider decreasing the Norvasc to 5 mg p.o. b.i.d.  4. The patient should be on a renal diet.  5. The patient should  not force fluid intake.  6. There is no indication for dialysis. Hopefully the kidney function will turn around without the need for dialysis therapy. This will have to be watched on a day by day basis.        THIS WAS ELECTRONICALLY SIGNED - 09/10/2005 1:52 PM PST BY: Janita Camberos Marlane Mingle, MD  ASSISTANT PROFESSOR  DIVISION OF NEPHROLOGY & TRANSPLANT  DEPARTMENT OF INTERNAL MEDICINE                  HQI:[id number])    D: 09/09/2005 07:52 AM  T: 09/09/2005 08:02 AM  C#: [id number]

## 2005-09-09 NOTE — Procedures (Signed)
PATIENT: Cathy Hunter, Cathy Hunter LOCATION: [facility]  MR #: [id number] SEX: F AGE: 72  OPERATION DATE: 09/08/2005 DOB: 1925-04-29    INPATIENT OPERATION RECORD    PREOPERATIVE DIAGNOSIS:    Rule out rejection.    POSTOPERATIVE DIAGNOSIS: Same.    PROCEDURE:    Transplant kidney biopsy.    INDICATION:    Rise in serum creatinine.    Informed consent for the procedure was obtained from the patient. The risks, benefits, and alternatives to the procedure were explained to the patient by me. She signed the informed consent.    The patient was taken to the ultrasound suite. There was no hydronephrosis. The point of entry was marked on the skin. The area was prepped and draped with Betadine. Using real-time ultrasound guidance and an 18-gauge biopsy needle, two passes were made at the lower pole of the transplanted kidney after infiltration of approximately 10 cc of 1% lidocaine. Two cores of kidney tissue were obtained. Postbiopsy images did not show any hematoma or arteriovenous malformation. The patient tolerated the procedure well. Postbiopsy images appeared stable. She will be transported back to [facility] for monitoring.              THIS WAS ELECTRONICALLY SIGNED - 09/10/2005 1:50 PM PST BY: Dwyane Dupree Marlane Mingle, MD  SURGEON  DEPARTMENT OF INTERNAL MEDICINE      AVW:[id number])    D: 09/08/2005 11:49 AM  T: 09/09/2005 11:17 AM  C#: [id number]

## 2005-09-10 NOTE — Progress Notes (Signed)
PATIENT: Cathy Hunter, Cathy Hunter LOCATION:   MR #: [id number] SEX: F AGE: 72  DATE OF SERVICE: 09/10/2005 DOB: 09-19-1925    INTERNAL MEDICINE INPATIENT PROGRESS NOTE    Patient is seen and examined by me this morning with the renal fellow, Dr. Alden Hipp. Her history and physical examination including laboratory data were reviewed by me. We developed a care plan together. Please see her full note for details also. The care plan is outlined in my note below.  SYSTEM REVIEW: Cathy Hunter had desaturation and shortness of breath yesterday when she tried to use the walker. She was ruled out for a myocardial infarction and troponin levels were basically undetectable x2 at 0.01. Her urine output remains marginal at 500-700 cc per day. Renal dysfunction continues to get worse.  SYSTEM REVIEW: No orthopnea or PND. Desaturation with walking yesterday. No cough. Mild abdominal discomfort. Complains of constipation. No dysuria or hematuria. Lower extremity edema is present bilaterally. No skin rash or arthritis. No bleeding. PHYSICAL EXAM: Temperature 97, heart rate 58-70 per minute, blood pressure 110/60 to 135/50, respiratory rate 16-18 per minute. O2 sat 94% on room air. Ins and outs: 930 cc in, 670 cc out. Cumulative I's and O's: 13 liters positive but that may not be accurate. Weight is 79 kg. Dry weight apparently near 70 kg. CVP 15.  Chest: Clear to auscultation anteriorly. Cardiac exam is normal. Abdomen is obese. Kidney transplant left lower quadrant mildly tender. No significant drainage from the wound. Extremity exam: Bilateral 2+ lower extremity edema.  LABORATORY DATA: Potassium 4.8, CO2 17, creatinine 3.9 mg/DCl, blood glucose 77 mg per DCl. Serum albumin 2.4. Liver function tests are stable. White count 4.7, hematocrit 26.4, platelet count 214. Iron studies show a low serum iron at 20, low TIBC at 168. Iron saturation is less than 10%. Ferritin is 321.  ASSESSMENT: 1. Acute on chronic dysfunction, acute renal insufficiency  likely secondary to ATN, superimposed on underlying chronic renal disease. Biopsy did not show any evidence of rejection but fair amount of chronicity.  2. No indication for dialysis today.  3. Volume overload. The patient is approximately 10 kg above her dry weight. CVP is 15. X-ray shows pulmonary vascular congestion. She desaturated with minimal exertion yesterday. Would recommend diuresis. Will give her 80 mg IV Lasix x1 and see what kind of response we get. 4. Immunosuppression. Prograf dose lowered drastically. The levels are being watched. Aim for a level of 7-10 nanograms/ml. 5. Anemia. Recommend initiating erythropoietin 10,000 units subcu twice a week. Transfuse p.r.n. if hematocrit goes less than 24. 6. Mild hyperphosphatemia secondary to renal insufficiency. Watch for now. Patient should be on a renal diet.  Hopefully her kidney will turn around without the need for dialysis therapy.        THIS WAS ELECTRONICALLY SIGNED - 09/10/2005 1:36 PM PST BY: Kasheena Sambrano Marlane Mingle, MD  ASSISTANT PROFESSOR  DIVISION OF NEPHROLOGY & TRANSPLANT  DEPARTMENT OF INTERNAL MEDICINE                  EXB:[id number])    D: 09/10/2005 11:08 AM  T: 09/10/2005 11:23 AM  C#: [id number]

## 2005-09-11 NOTE — Progress Notes (Signed)
PATIENT: Cathy Hunter, Cathy Hunter LOCATION:   MR #: [id number] SEX: F AGE: 72  DATE OF SERVICE: 09/11/2005 DOB: 07-20-1925    INTERNAL MEDICINE INPATIENT PROGRESS NOTE    TRANSPLANT NEPHROLOGY PROGRESS NOTE  Patient is seen and examined by me this morning.  Feels better today, because she had an adequate bowel movement yesterday with two to four stools. Urine output not clearly recorded. Prograf therapy discontinued, and the patient given 2 mg/kg of daclizumab. She is currently only on CellCept 500 mg p.o. b.i.d. SYSTEMS REVIEW: No fever. No chills. No chest pain. No shortness of breath. No orthopnea or PND. No dysuria or hematuria. Lower extremity edema persists. No skin rash or arthritis. PHYSICAL EXAM: Temperature 98, heart rate 70 to 80 per minute, blood pressure 137/58 to 156/59. O2 sat 94% on room air. Blood sugar 118 to 120 mg/dl. Ins and outs: 1 liter in; 400 cc out. Neck: Triple lumen catheter on the left side. Chest: Clear to auscultation anteriorly. Cardiac exam: Normal. Abdomen is obese. Kidney transplant in the left lower quadrant is nontender. Extremity exam, 2+ peripheral edema.  LABORATORY DATA: Potassium 4.6, CO2 19, BUN 66, creatinine 4.0. Blood glucose 99 mg/dl. Serum phosphorus 5.4. White count 5.5, hematocrit 26.3, platelet count 244. Urine osmolality 276. ASSESSMENT: 1. Acute on chronic renal insufficiency. 2. No indication for hemodialysis.  3. Immunosuppression on CellCept monotherapy. Recommend re- initiating Prograf at 3 mg p.o. b.i.d.  4. Hypertension. Mild systolic hypertension. Watch for now. I want to have good perfusion pressure to the kidney.  5. Volume status. Still overloaded. Recommend repeating a dose of 80 mg of IV Lasix; however, somehow weight was not obtained today, and I have asked them to obtain a weight on this lady.  6. Anemia. Procrit therapy started yesterday.  7. Diabetes mellitus. Under excellent control.          THIS WAS ELECTRONICALLY SIGNED - 09/12/2005 5:40 PM PST BY: Garlon Tuggle Marlane Mingle, MD  ASSISTANT PROFESSOR  DIVISION OF NEPHROLOGY & TRANSPLANT  DEPARTMENT OF INTERNAL MEDICINE                  HQI:[id number])    D: 09/11/2005 10:59 AM  T: 09/11/2005 07:47 PM  C#: [id number]

## 2005-09-12 NOTE — Progress Notes (Signed)
PATIENT: Cathy Hunter, BOEHNING LOCATION:   MR #: [id number] SEX: F AGE: 72  DATE OF SERVICE: 09/12/2005 DOB: 1925-01-28    INTERNAL MEDICINE INPATIENT PROGRESS NOTE    TRANSPLANT NEPHROLOGY PROGRESS NOTE:  Patient is seen and examined by me with the renal fellow, Dr. Mercy Moore. His history and physical examination including laboratory data were reviewed by me. We developed the care plan together. Please see his full note for details also.  SYSTEM REVIEW: Overall feels well. Abdominal discomfort much better since she had a bowel movement. No cough. No shortness of breath. Urinary frequency and urgency present. Stable lower extremity edema and weight. Urine output appears to have increased. PHYSICAL EXAM: See Dr. Karenann Cai note. Independently verified and confirmed by me. Of note, chest remains clear to auscultation. There is no pericardial rub. There is stable 2+ lower extremity edema. LABORATORY DATA: Potassium 4.6, CO2 19, creatinine 3.8 mg/dL, blood glucose 284 mg/dL. Hematocrit 25. Platelet count 258. ASSESSMENT: A 72 year old female with stage V chronic kidney disease secondary to diabetes. Status post deceased donor kidney transplant September 01, 2005.  1. Acute on chronic renal failure appears to be stabilizing. I think the serum creatinine will be better tomorrow.  2. No indication for dialysis.  3. Immunosuppression. Currently on CellCept monotherapy. Recommend initiating Prograf at a low dose to keep the levels at 4-6 ng/mL. That would be my personal preference because if the patient were to develop an acute rejection then this kidney is going to go significantly downhill.  4. Infectious disease prophylaxis on Valcyte and dapsone. 5. Anemia. Low iron saturations with low ferritin. Consider transfusion of IV iron, venofer intravenously times 2 doses, today and tomorrow.  6. Anemia on erythropoietin.            THIS WAS ELECTRONICALLY SIGNED - 09/12/2005 5:35 PM PST BY: Richmond Coldren Marlane Mingle, MD  ASSISTANT PROFESSOR  DIVISION OF  NEPHROLOGY & TRANSPLANT  DEPARTMENT OF INTERNAL MEDICINE                  XLK:[id number])    D: 09/12/2005 08:06 AM  T: 09/12/2005 12:44 PM  C#: [id number]

## 2005-09-13 NOTE — Progress Notes (Signed)
PATIENT: Cathy Hunter, KLAHR LOCATION:   MR #: [id number] SEX: F AGE: 72  DATE OF SERVICE: 09/13/2005 DOB: 09/09/1925    INTERNAL MEDICINE INPATIENT PROGRESS NOTE    NEPHROLOGY PROGRESS NOTE  Patient is seen and examined by me with the renal fellow, Dr. Mercy Moore. His history and physical examination including laboratory data were reviewed by me. We developed a care plan together. Please see his full note for details. The care plan is also outlined in my note below. SYSTEM REVIEW: Status post two units of packed red blood cell transfusion yesterday along with 120 mg of intravenous Lasix between doses. No fever. No chills. Does not complain of shortness of breath, although ambulation has been limited. No dysuria or hematuria. Mild lower extremity edema. No diarrhea. Tolerated blood transfusion okay. PHYSICAL EXAM AND LABORATORY DATA: Please see Dr. Karenann Cai note from September 13, 2005.  ASSESSMENT: 1. Acute on chronic renal insufficiency. Creatinine has peaked and is very slowly decreasing.  2. No indication for dialysis.  3. Immunosuppression. On CellCept monotherapy after receiving a dose of daclizumab on Friday. Prograf to be started tomorrow morning. 4. Hypertension, stable at this time.  5. Anemia, status post packed red blood cell transfusion. One could consider another transfusion if the hematocrit becomes lower.        THIS WAS ELECTRONICALLY SIGNED - 09/14/2005 8:02 AM PST BY: Azaria Stegman Marlane Mingle, MD  ASSISTANT PROFESSOR  DIVISION OF NEPHROLOGY & TRANSPLANT  DEPARTMENT OF INTERNAL MEDICINE                  NUU:[id number])    D: 09/13/2005 04:57 PM  T: 09/13/2005 08:29 PM  C#: [id number]

## 2005-09-14 LAB — CBC NO DIFFERENTIAL
Hematocrit: 31 % — ABNORMAL LOW (ref 36–46)
Hemoglobin: 10.2 g/dL — ABNORMAL LOW (ref 12.0–16.0)
MCH: 29.3 pg (ref 27–33)
MCHC: 32.9 % (ref 32–36)
MCV: 88.9 UM3 (ref 80–100)
Platelet Count: 284 K/MM3 (ref 130–400)
RDW: 17.8 U — ABNORMAL HIGH (ref 0–14.7)
Red Blood Cell Count: 3.48 M/MM3 — ABNORMAL LOW (ref 4.0–5.2)
White Blood Cell Count: 7.8 K/MM3 (ref 4.5–11.0)

## 2005-09-14 LAB — TACROLIMUS (FK506) LEVEL: Tacrolimus (FK506): <1 ng/mL — ABNORMAL LOW (ref 5–20)

## 2005-09-14 LAB — PHOSPHORUS (PO4): Phosphorus (PO4): 5.3 mg/dL — ABNORMAL HIGH (ref 2.4–5.0)

## 2005-09-14 LAB — MAGNESIUM (MG): Magnesium (Mg): 2.8 mg/dL — ABNORMAL HIGH (ref 1.5–2.6)

## 2005-09-14 NOTE — Progress Notes (Signed)
PATIENT: Cathy Hunter, Cathy Hunter LOCATION:   MR #: [id number] SEX: F AGE: 72  DATE OF SERVICE: 09/14/2005 DOB: 1925-04-14    INTERNAL MEDICINE INPATIENT PROGRESS NOTE    I have seen and examined the patient, and have reviewed Dr. Jeni Salles note, including her history, physical examination and assessment, and I agree with these. We discussed the patient and jointly developed a care plan. Please refer to her note for full details of the patient course overnight.  There were no significant events for Cathy Hunter. She has no complaints. PHYSICAL EXAMINATION: Per Dr. Jeni Salles note.   LABORATORY DATA: Per Dr. Jeni Salles note.   IMPRESSION: A 72 year old female status post deceased donor renal transplant with acute kidney injury presumed due to acute tubular necrosis which appears to be improving. Urine output is increasing and serum creatinine is decreasing with creatinine 3.3 mg/dL today. RECOMMEND: 1. Continue maintenance immunosuppression as currently. Await tacrolimus trough level for adjustment of Prograf dose. Recommend target level 8 ng/mL.  2. Anemia, improved and stable after blood transfusion. 3. Electrolytes. These are within acceptable limits. 4. Infectious disease prophylaxis. Continued dapsone and valganciclovir. Valganciclovir is adjusted for level of renal function.        THIS WAS ELECTRONICALLY SIGNED - 09/14/2005 5:19 PM PST BY: Sabino Gasser, MD, PHD  ASSOCIATE PROFESSOR  DIVISION OF NEPHROLOGY & TRANSPLANT  DEPARTMENT OF INTERNAL MEDICINE                  HYQ:[id number])    D: 09/14/2005 11:21 AM  T: 09/14/2005 12:35 PM  C#: [id number]

## 2005-09-16 ENCOUNTER — Other Ambulatory Visit: Payer: Self-pay | Admitting: Nephrology

## 2005-09-16 ENCOUNTER — Encounter: Admitting: Specialist

## 2005-09-16 NOTE — Discharge Summary (Signed)
PATIENT: Cathy Hunter, Cathy Hunter LOCATION: [facility]  MR #: [id number] SEX: F AGE: 72   DOB: June 22, 1925  ADMISSION DATE: September 05, 2005 DISCHARGE DATE: 09/16/2005    INPATIENT DISCHARGE SUMMARY    DISCHARGE DIAGNOSIS:     Status post deceased donor renal transplantation.     REASON FOR ADMISSION:    Cathy Hunter is a 72 year old woman with end-stage renal disease secondary to diabetes mellitus receiving hemodialysis Tuesday, Thursday, Saturday, who was notified on 2005-09-05, that a deceased donor has been located. She was brought into the hospital for a deceased donor renal transplantation and was cleared for surgery.     PROCEDURES PERFORMED:    1. On 09/06/2005, deceased donor renal transplantation and insertion of double-J ureteral stent.   2. On 09/08/05, biopsy of renal transplant     CONSULTATIONS:     Transplant nephrology and pharmacy.    COMPLICATIONS:     Rising creatinine, please see hospital course.    HOSPITAL COURSE:    Cathy Hunter tolerated her surgery well. There were no complications during the initial procedure of the deceased donor renal transplantation. The patient's preoperative creatinine of 8.9 decreased after her transplantation to a nadir of 1.7 on 09/06/05. However, afterwards the creatinine slowly began to rise until it reached a peak on 09/10/05, at 70.0. The patient was still making an adequate amount of urine greater than 0.5 mL per kg per body weight per hour. She was taken for a renal biopsy on 09/08/05, of the transplanted kidney, which showed no acute rejection but did show evidence of chronic changes. Because this is consistent with the known changes to the renal allograft, the patient was closely monitored and had her Prograf levels held for several days in the thought that the Prograf was causing a renal toxicity. On 09/10/05, she was given 1 dose of Zenapax at 2 mg per kg body weight IV x1, while the Prograf was held. The Prograf was restarted on 09/14/05, after the patient's creatinine was again noted to be  falling. Of note, during the patient's hospital course, she was required 2 units of packed red blood cells to be transfused for slowing drifting hematocrit. On the day of discharge patient was noted to be doing well with a creatinine that is falling, an adequate urine output and adequate p.o. intake.       DISPOSITION:     Medically stable for discharged to home with close followup.     FOLLOWUP CARE:    Transplant surgery clinic on Wednesday at 09/16/05 at 10:50 a.m. Phone number is [phone].     No dietary restrictions. No fluid restrictions. No heavy lifting or strenuous exercise for greater than six weeks. She is to contact the clinic for any questions and signs and symptoms of infection, pain, decreased urine output.     DISCHARGE MEDICATIONS:    Per pharmacy Prograf 3 mg p.o. b.i.d., CellCept 500 mg p.o. b.i.d., famotidine 20 mg p.o. b.i.d., multivitamin 1 tablet daily, Colace 100 mg p.o. daily, aspirin 81 mg p.o. daily, lovastatin 20 mg p.o. daily, Norvasc 5 mg p.o. b.i.d., dapsone 100 mg p.o. daily, Valcyte 450 mg p.o. Monday, Wednesday, Friday. Labetalol 400 mg p.o. b.i.d., Epogen 10,000 units subcu q. Monday and Thursday. Regular insulin sliding scale.               THIS WAS ELECTRONICALLY SIGNED - 09/22/2005 8:18 AM PST BY: Corrinne Eagle, MD  RESIDENT  DIVISION OF   DEPARTMENT OF SURGERY  KS:[id number])    D: 09/14/2005  T: 09/16/2005 12:24 PM   C#: [id number]

## 2005-09-17 LAB — CULTURE URINE, BACTI

## 2005-09-23 ENCOUNTER — Encounter: Admitting: Specialist

## 2005-09-30 ENCOUNTER — Encounter: Admitting: Nephrology

## 2005-10-02 NOTE — Progress Notes (Addendum)
PATIENT: Cathy Hunter, Cathy Hunter   MR #: [id number] SEX: F AGE: 72  DATE OF SERVICE: 09/23/2005 DOB: Oct 28, 1925    TRANSPLANT CENTER CLINIC NOTE    Ms. Henriquez is a 72 year old female with stage V chronic kidney disease due to diabetic nephropathy, status post deceased-donor renal transplant on August 28. She received a transplant from a previous transplant recipient. She initially had slow allograft function but has had steady slow improvement in her creatinine.    SUBJECTIVE:    She returns today for followup and actually looks quite well and states she is feeling well. If anything, she is frustrated at not being allowed to do more physically active things.    She states that her blood pressures are generally in the 140s to 150s but can increase up to the 180s.    REVIEW OF SYSTEMS:    CONSTITUTIONAL: No fevers, chills or sweats. She has a good appetite and improving energy level. EYES: Negative. ENT: Negative. PULMONARY: No cough, wheezing, shortness of breath or sputum production. CARDIOVASCULAR: No chest pain, palpitations, paroxysmal nocturnal dyspnea or orthopnea. Persistent lower-extremity edema but improving over time. GI: No nausea, vomiting or diarrhea. GENITOURINARY: No urinary urgency, frequency or dysuria. She previously had urinary incontinence, which has resolved. MUSCULOSKELETAL: Negative. SKIN: Negative. NEUROLOGIC: Negative.     MEDICATIONS:    CellCept 500 mg b.i.d.; Prograf 3 mg b.i.d.; Colace 100 mg b.i.d.; labetalol 400 mg b.i.d.; dapsone 180 mg daily; erythropoietin 10,000 units twice a week; felodipine 5 mg daily; lovastatin 20 mg daily; Valcyte 450 mg Monday, Wednesday and Friday; multivitamins 2 daily; aspirin 81 mg daily; and Pepcid 20 mg b.i.d.    PHYSICAL EXAMINATION:    VITAL SIGNS: Weight is 80 kg, afebrile, pulse 75 and blood pressure 176/70.  HEENT: Oropharynx is clear.  NECK: No lymphadenopathy. No jugular venous distention. LUNGS: Clear to auscultation.  HEART: Regular rhythm.  There are S1, S2, no S3, S4, rub or gallop. There is a 2/6 systolic murmur at the left upper sternal border. ABDOMEN: Obese, soft, nontender and nondistended. No mass, no organomegaly and no bruit. Normal bowel sounds. Staples are in place in the left lower quadrant incision, without drainage or tenderness. EXTREMITIES: There is 1+ edema to the midcalves bilaterally. Decreased dorsalis pedis pulses.   SKIN: No rash.  NEUROLOGIC: Cranial nerves II-XI are intact. Motor strength is 4+/5 throughout. Sensory reveals decreased sensation to light touch in a stocking distribution. No dysmetria, ataxia or tremor.    LABORATORY DATA:    Laboratory data from September 18 reveal BUN and creatinine 26 and 2.1, decreased from 40 and 3.0 on September 15. Potassium is 3.6 and bicarbonate 16. Calcium is 8.9, phosphorus 2.8 and magnesium 1.6. Albumin is 3.3. Liver function tests are within normal limits. Hematocrit is 31%, white count 4.5. Urinalysis reveals 100-mg/dL protein, 3 to 8 white cells per high-power field and one red cell per high-power field. Tacrolimus trough level is 8.4 ng/mL.    IMPRESSION AND PLAN:    1. Status post deceased-donor renal transplant. She has steadily improving renal allograft function. We will need to monitor the proteinuria. My recollection is that the donor kidney, which is a domino allograft, did not have evidence of significant chronic allograft nephropathy. Therefore, I am uncertain whether or not to expect proteinuria.     2. Immunosuppression. Tacrolimus trough level is within acceptable limits. Continue Prograf and CellCept.    3. Electrolytes. These are within acceptable limits.    4. Hematologic.  Cell counts are stable. Continue erythropoietin.    5. Hypertension. I do not want to very aggressively lower her blood pressures. She still has some peripheral edema and therefore we will start Lasix 20 mg daily. This may help moderate her blood pressures. If not, we will need to address this more  aggressively next week.    She will return to clinic in one week.      THIS WAS ELECTRONICALLY SIGNED - 10/02/2005 1:22 PM PST BY: Sabino Gasser, MD, PHD  ASSOCIATE PROFESSOR  DIVISION OF NEPHROLOGY & TRANSPLANT  DEPARTMENT OF INTERNAL MEDICINE    ZOX:[id number])    D: 09/23/2005 03:48 PM  T: 10/02/2005 01:05 PM  C#: [id number]    cc:   Nanetta Batty, MD  [facility]   [address]  [place], [place] [address]    Hope Budds, MD  [facility]  [address]  [place], [place] [address]

## 2005-10-04 NOTE — Progress Notes (Addendum)
PATIENT: Cathy Hunter, Cathy Hunter   MR #: [id number] SEX: F AGE: 72  DATE OF SERVICE: 09/16/2005 DOB: April 18, 1925    TRANSPLANT CENTER CLINIC NOTE    Ms. Cathy Hunter is a 72 year old female who received a deceased donor kidney transplant on 09/01/05. This was complicated by the development of acute renal failure and her creatinine peaked to 4.5 mg/dL from a baseline of 1.8. A kidney biopsy showed chronic changes in the tubular interstitium, but no evidence of rejection. Since then the serum creatinine has been coming down, albeit slowly.    REVIEW OF SYSTEMS:    Weight gain and lower extremity edema since discharge. Weight 79.5 kg, which is approximately 10 kg above her dry weight. Shortness of breath with exertion. No orthopnea or PND. No dysuria or hematuria. No abdominal pain. Appetite is improving slowly. No unusual skin lesions.    PHYSICAL EXAMINATION:    VITAL SIGNS: Weight 79.5 kg, similar to patient's weight in the hospital, but much higher than her dry weight. Temperature 37, respiratory rate 18, pulse 80 per minute, blood pressure 180/68. NECK: Jugular venous pressure elevated at approximately 8 cm. CHEST: Clear to auscultation.  HEART: Exam normal.  ABDOMEN: Soft and obese. Kidney transplant in the left lower quadrant is nontender.  EXTREMITIES: She has 3+ peripheral edema.    IMMUNOSUPPRESSION:    Prograf 3 mg p.o. b.i.d., CellCept 500 mg p.o. b.i.d.    LABORATORY DATA 09/16/05:    Laboratory data from today are pending.    ASSESSMENT:    1. Slowly improving renal insufficiency. We shall see what the laboratory data from today show.    2. Immunosuppression. Continue Prograf 3 mg p.o. b.i.d., CellCept 500 mg p.o. b.i.d.    3. Infectious disease prophylaxis. Continue Valcyte and Dapsone.    4. Hypertension. Likely volume related. I will add Lasix 80 mg p.o. b.i.d. starting today or tomorrow.    5. Patient to call us back with her weight after 48 hours to see if the Lasix is working.    6. Anemia. On  Erythropoietin therapy.    Return to clinic in one week.      THIS WAS ELECTRONICALLY SIGNED - 10/04/2005 5:33 PM PST BY: Keashia Haskins Marlane Mingle, MD  ASSISTANT PROFESSOR  DIVISION OF NEPHROLOGY & TRANSPLANT  DEPARTMENT OF INTERNAL MEDICINE      ION:[id number])    D: 09/16/2005 02:31 PM  T: 09/24/2005 04:16 PM  C#: [id number]

## 2005-10-07 ENCOUNTER — Ambulatory Visit

## 2005-10-07 ENCOUNTER — Other Ambulatory Visit: Payer: Self-pay | Admitting: Transplant Surgery

## 2005-10-07 ENCOUNTER — Encounter

## 2005-10-07 NOTE — Progress Notes (Signed)
58yr July 24, 1925    Scheduled Surgery Date: 10/15/05  Proposed Surgery: Cystoscopy, Ureteral stent removal  Pre-op Dx: s/p kidney transplant 09/01/05  Surgeon: Dr Carlynn Purl    Problems:  ------------  ESRD 2nd DM nephropathy s/p Renal transplant on immunosuppression  DM II  HTN      PSH:  ------  Cadaveric renal transplant 09/01/05  L AVF placmenet  Abdominal hysterectomy  R total hip replacement  Open Chole  R knee arthroscopy  Right shoulder arthroscopy      Anesthetic Hx:   09/01/05 - DLx1, Gd 2, Mac 3, no complications, hypertensive in PACU > treated with labatelol    Allergies:   -- Penicillin G    -- Codeine    -- Talwin (Pentazocine Lactate)    -- Iodine    -- Azithromycin    -- Sulfur    -- Vicodin (Hydrocodone-acetaminophen)     Current outpatient prescriptions:  VALCYTE 450 MG TAB, 1 tab twice daily  PEPCID 20 MG TAB, 1 tab as needed  COLACE 100 MG CAP, 1 tab twice daily  MULTI-VITAMIN PO, 1 tab daily  FELODIPINE SR 5 MG 24 HR TAB, 1 tab twice daily  MAGNESIUM 300 MG CAP, 1 tab twice daily  IRON (FERROUS SULFATE) PO, 65 mg Mon, Wed, Friday  K-PHOS-NEUTRAL PO, 1 tabs twice daily  EPOGEN INJ, 2 times weekly (Tues, Sat)  PROGRAF PO, 4 mg twice daily  CELLCEPT 250 MG CAP, 1 tabs twice daily  DAPSONE 100 MG TAB, 1 tab daily  ASPIRIN 81 MG TAB, 1 tablet daily  LOVASTATIN 20 MG TAB, 1 tablet daily  LABETOLOL 200mg  twice daily     Drug/Alcohol Hx:  ----------------------   Alcohol Use: No      Drug Use: No      Tobacco Use: Quit Smoked Years: 15.0   Comment: quit tobacco x8yrs ago      CV Tests: EKG (09/09/05) NSR @ 70  Echocardiogram (09/11/05) Normal wall motion. Estimated LVEF 60%. 3+ tricuspid regurgitation. 1+ Mitral regurgitation.      ROS:  -------  CV: HTN, hyperlipidemia  Resp: None  Neuro: None  Musculoskeletal: None  Med: ESRD, DM Type II, benign pancreatic mass    Activity: walk 1/2 block    PE:  ----  Airway: MP 2 and Dentition good  Neck: ROM normal  Lungs: clear to auscultation bilaterally  Card: regular    Pt.  Instructions: NPO Guidelines, Medications    [name], RN, BSN        Anesthesiologist Note:                Consent:                           Day of Surgery Evaluation:                   Anesthesia Plan:                  Date/Time:  M.D. Signature      Preliminary until signed by anesthesiologist. Final copy is in the paper chart.

## 2005-10-09 NOTE — Progress Notes (Addendum)
PATIENT: Cathy Hunter, Cathy Hunter   MR #: [id number] SEX: F AGE: 72  DATE OF SERVICE: 09/30/2005 DOB: 29-Mar-1925    TRANSPLANT CENTER CLINIC NOTE   Cathy Hunter is a 72 year old African American lady with type 2 diabetes mellitus and end-stage renal disease presumed secondary to diabetic neuropathy who received a deceased donor renal transplant on September 01, 2005. The donor was a 72 year old diabetic who had received a living-donor renal transplant eight years earlier and passed away following subarachnoid hemorrhage. 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 Interstitial atrophy and fibrosis involving about 10% to 15% of the parenchyma, mild-to-moderate arterial hyalinosis, and arterial intimal hyperplasia. Pulsatile perfusion was used prior to reanastomosis of the allograft.    She received Thymoglobulin induction and had immediate onset of renal allograft function with decreased in serum creatinine to 1.7 mg/dL. However, creatinine increased up to 4.0 mg/dL and she underwent an allograft biopsy on September 08, 2005 which did not reveal any evidence of rejection. Histologic findings were essentially similar to that on the time zero biopsy. The possibility of allograft dysfunction secondary to tacrolimus toxicity was considered. Tacrolimus was withdrawn and she received one dose of Daclizumab. As renal allograft function improved, tacrolimus was resumed. Serum creatinine on discharge (postoperative day #13) was 3.3 mg/dL and during subsequent outpatient follow-up has decreased to 1.5 mg/dL. Her maintenance immunosuppression comprises MMF and tacrolimus.    In addition to type 2 diabetes mellitus and hypertension, her his is also remarkable for vascular access creation, abdominal hysterectomy (without salpingo-oophorectomy), right total hip arthroplasty, bilateral wrist surgeries with hardware, and right knee and shoulder arthroscopy.    REVIEW OF SYSTEMS: Cathy Hunter told me  that she was continuing to do well. Edema has decreased and she has discontinued using furosemide. She told me that her urine was clear, was not smelly or cloudy, and that she did not have dysuria, pain over the allograft, fevers or chills. A complete review of systems was otherwise negative.    CURRENT MEDICATIONS: MMF 500 mg twice a day, tacrolimus 3 mg twice a day, labetalol 400 mg twice a day, felodipine 5 mg once a day, aspirin 81 mg a day, lovastatin 20 mg a day, erythropoietin 10,000 subcutaneously twice a week, valganciclovir 450 mg on Mondays, Wednesdays, Fridays, dapsone 100 mg a day, famotidine 20 p.r.n., docusate 100 mg p.r.n.    ALLERGIES: She is allergic to penicillin (angioedema) and to codeine and pentazocine (abdominal cramps and vomiting). She also is allergic to sulfonamides but could not remember the nature of the allergy.    PHYSICAL EXAMINATION: General appearance: She was alert and oriented. Vital signs: Weight 71.1 kg, blood pressure 168/71 mmHg, pulse 66 per minute, regular, temperature 37 degrees centigrade. HEENT: She did not have conjunctival pallor or scleral icterus. All mucous membranes are moist. She did not have oral thrush or ulceration. Neck: Her JVP was not elevated. No bruits were heard over the carotids. She did not have lymphadenopathy. Cardiovascular: Normal heart sounds were heard. Respiratory: Vesicular breath sounds were heard. Abdomen: Soft and nontender. The transplant surgical incision is healing well. She still has staples. The renal allograft was firm and nontender. No bruit was heard over the allograft. Extremities: She had pedal edema.    LABORATORY DATA: September 29, 2005: Hemoglobin 8.9, hematocrit 28.7, white cell count 3.5, platelet count 287,000. BUN 18, creatinine 1.53, sodium 144, potassium 3.5, chloride 113, carbon dioxide 20, glucose 78. Calcium 9.0, phosphorus 1.9, magnesium  1.5, albumin 3.5, total bilirubin 1.1, AST 15, ALT 7, ALP 92. Tacrolimus trough  level pending. Urinalysis was remarkable for large amount of leukocyte esterase and 50 mg/dL of protein. Results of urine culture are awaited. Microscopy of the urine sediment performed on September 22nd was remarkable for hematuria and pyuria. On culture, Enterococcus species were isolated which was sensitive only to ampicillin.    ASSESSMENT AND PLAN:  1. Deceased-donor renal transplant recipient one month post- transplant with excellent renal allograft function. She is on dual- maintenance immunosuppressive therapy. Her tacrolimus trough level was 6.1 and I have advised her to increase the dose to 4.0 mg twice a day.  2. Type 2 diabetes mellitus - on sliding scale insulin. 3. Hypertension - home blood pressure levels range between 150 and 170 mmHg systolic and between 77 and 80 mmHg diastolic. She has edema and is not on a strict no-added-salt diet. I have advised her to restrict dietary sodium intake and I have explained that blood pressure levels would probably decrease as her volume status corrects. 4. Antimicrobial prophylaxis - on dapsone and valganciclovir. With improved renal function, she will take valganciclovir 450 mg once a day.  5. Hypomagnesemia - I have asked her to take magnesium oxide tablets twice a day.  6. Hypophosphatemia - I have given her a prescription for K-Phos Neutral two tablets twice a day. I have also asked her to increase her intake of dairy products.  7. Anemia - on erythropoietin. I have recommended that she start FeSO4 one tablet twice a day.    She will have laboratory tests twice a week and we will see her for a follow-up visit in two weeks' time.        THIS WAS ELECTRONICALLY SIGNED - 10/09/2005 9:38 AM PST BY: Cathy Hires, MD  ASSOCIATE CLINICAL PROFESSOR  DIVISION OF NEPHROLOGY  DEPARTMENT OF INTERNAL MEDICINE                  IO:[id number])    D: 09/30/2005 12:35 PM  T: 10/08/2005 11:38 AM  C#: [id number]    cc:   Cathy Klinefelter, MD, [address], [address], [place], [place] [address]   Cathy Budds, MD, [address], [address] [place], [place] [address]   Marily Memos, MD, [address] [place], [place] [address]

## 2005-10-11 NOTE — H&P (Addendum)
PATIENT: Cathy Hunter, Cathy Hunter   MR #: [id number] SEX: F AGE: 72  ADMISSION DATE: 10/15/2005 DOB: 05/09/1925    INPATIENT TBA HISTORY AND PHYSICAL    DATE OF EXAM: 10/07/2005    PLANNED PROCEDURE: Ureteral J-stent removal.     HISTORY OF PRESENT ILLNESS:     Cathy Hunter is a 72 year old woman who is status post deceased donor transplantation done on 09/01/2005. During the procedure, a ureteral J-stent was placed on the ureteral vesicular anastomosis. She presents today in clinic for preop procedure planning. She notes that she has been doing well since the procedure and denies any nausea, vomiting, fever, chills, diarrhea, lethargy or malaise. She notes that she is urinating regularly without difficulty, without burning, hesitancy or urgency.     PAST MEDICAL HISTORY:     1. The patient had end-stage renal disease secondary to diabetes. She is currently doing well with her transplanted kidney. 2. Diabetes type 2, which is diet controlled.   3. Hypertension.  4. The patient reports that she had a benign pancreatic mass.    PAST SURGICAL HISTORY:     1. Left AV fistula.   2. Total abdominal hysterectomy.   3. Right total hip replacement.   4. Cholecystectomy.   5. Right knee arthroscopy.   6. Right shoulder arthroscopy.   7. Left wrist surgery with pinning.   8. Right upper extremity vascular graft.     ALLERGIES:     The patient states that she is allergic to CODEINE, PENICILLIN, TALWIN, AZITHROMYCIN, SULFA, AND VICODIN.     CURRENT MEDICATIONS:     1. Lasix 20 mg p.o. q. day.   2. Multivitamin 2 tabs p.o. q. day.   3. Aspirin 81 mg p.o. q. day.   4. Famotidine 20 mg p.o. b.i.d.   5. K-Phos 2 tabs p.o. b.i.d.   6. Docusate 100 mg p.o. b.i.d.   7. Labetalol 400 mg p.o. b.i.d.   8. Dapsone 100 mg p.o. q. day.   9. Valcyte 450 mg p.o. every Monday, Wednesday, Friday. 10. Magnesium 300 mg q. day.   11. Epogen 10,000 units subcu twice weekly.   12. Felodipine 5 mg p.o. q. day.   13. Lovastatin 20 mg p.o. q. day.   14.  Prograf 4 mg p.o. b.i.d.   15. CellCept 500 mg p.o. b.i.d.     FAMILY HISTORY:     Notable for kidney failure in a brother and glomerulonephritis in a son.     SOCIAL HISTORY:     The patient does not smoke, does not drink alcohol or engage in any IV drug abuse. She does have family in the area to help her with support.     REVIEW OF SYSTEMS:     In addition to the aforementioned, the patient denies any blurry vision, headaches, any trouble swallowing, any abdominal pain, any unusual rashes.     PHYSICAL EXAMINATION:     VITAL SIGNS: Current temperature is 37 degrees. Blood pressure is 127/54. Pulse is 65. Weight today is 72.9 kg.     GENERAL: The patient is in no apparent distress, alert and oriented x3.     HEAD/EARS/EYES/NOSE/THROAT: The patient is normocephalic, atraumatic with moist mucous membranes. Oropharynx is clear without erythema or exudate. There is no lymphadenopathy.     CARDIOVASCULAR: The patient has a normal rate, regular rhythm.     PULMONARY: The patient is clear to auscultation bilaterally.     ABDOMEN: Obese, soft, nontender, nondistended.  There is a well- healed, well-approximated incision on the left lower quadrant. There is a palpable mass deep to this area, which is the transplanted kidney. Additionally, there is a well-healed right subcostal incision from the patient's cholecystectomy. Additionally, there is a right lower quadrant incision; the patient states that this is from her abdominal hysterectomy.     EXTREMITIES: There are palpable bilateral dorsalis pedis pulses in the patient's lower extremities. There was 1+ pitting edema bilaterally as well.     NEUROLOGIC: There are no focal neurological deficits on this patient.     ASSESSMENT AND PLAN:     This is a 72 year old female who is status post deceased donor renal transplantation done on 09/01/2005 who presents today in clinic for followup and preoperative evaluation for a J ureteral stent removal.     Informed consent was obtained  and the risks and benefits of the procedure, including infection, bleeding, and risks of anesthesia, have been discussed in detail with the patient. She agrees at this point in time to proceed with the stent removal. Additionally, it was noted in a recent urine culture from 10/06/2005 that the patient is growing out greater than 100,000 colonies of Enterococcus. Given this, we have started the patient on ciprofloxacin 250 mg p.o. b.i.d. for 7 days, which will last until the day of her procedure.     The patient was seen in conjunction with Dr. Rise Patience. He has agreed on the plan of care for this patient.           THIS WAS ELECTRONICALLY SIGNED - 10/11/2005 12:56 AM PST BY: Vernice Jefferson, MD    THIS WAS ELECTRONICALLY SIGNED - 10/07/2005 12:13 PM PST BY: Sherryl Manges, MD  RESIDENT  DEPARTMENT OF SURGERY      ZO:[id number])    D: 10/07/2005 10:17 AM  T: 10/07/2005 10:55 AM  C#: [id number]

## 2005-10-14 ENCOUNTER — Encounter: Admitting: Specialist

## 2005-10-15 ENCOUNTER — Ambulatory Visit: Admit: 2005-10-15 | Attending: Transplant Surgery | Admitting: Transplant Surgery

## 2005-10-15 NOTE — Procedures (Signed)
PATIENT: Cathy Hunter, PECKINPAUGH LOCATION:   MR #: [id number] SEX: F AGE: 72  OPERATION DATE: 10/15/2005 DOB: 12-09-1925    INPATIENT OPERATION RECORD    PREOPERATIVE DIAGNOSIS:     Status post renal transplant.     POSTOPERATIVE DIAGNOSIS:     Same.     OPERATIVE PROCEDURE:     Cystoscopy with removal of ureteral stent.     ANESTHESIA: General.     CASE HISTORY:     This 72 year old female with a history of end-stage renal disease is status post deceased-donor domino renal transplant approximately 6 weeks ago. She is now admitted for removal of ureteral stent that was placed at the time of transplant.     DETAILS OF PROCEDURE:     After appropriate surgical pause, confirmation of patient identification, procedure and site, general anesthesia was induced. The patient was placed in the lithotomy position. The perineum was sterilely prepped and draped. A flexible cystoscope was passed through the urethra into the bladder. The bladder mucosa appeared normal. The ureteral stent was visualized, grasped and removed. The bladder was decompressed with a red rubber tube and the patient was awakened and transported to the Recovery Room in stable condition. There were no complications encountered. All sponge, needle and instrument counts were correct.                   THIS WAS ELECTRONICALLY SIGNED - 10/15/2005 12:00 PM PST BY: Vernice Jefferson, MD  SURGEON   DEPARTMENT OF SURGERY      ZHY:[id number])    D: 10/15/2005 10:23 AM  T: 10/15/2005 10:51 AM  C#: [id number]

## 2005-10-19 ENCOUNTER — Encounter: Admitting: Specialist

## 2005-10-20 ENCOUNTER — Encounter: Admitting: Specialist

## 2005-10-20 ENCOUNTER — Ambulatory Visit

## 2005-10-21 ENCOUNTER — Encounter: Admitting: Specialist

## 2005-10-21 ENCOUNTER — Encounter: Admitting: Nephrology

## 2005-10-26 NOTE — Progress Notes (Addendum)
PATIENT: Cathy Hunter, Cathy Hunter LOCATIONJules Schick   MR #: [id number] SEX: F AGE: 72  DATE OF SERVICE: 10/21/2005 DOB: 05-Apr-1925    TRANSPLANT CENTER CLINIC NOTE    Today, I am seeing Cathy Hunter for the followup of her kidney transplant. She is a 72 year old woman with end-stage renal disease attributed to diabetes, yet no native kidney biopsy has been done. She underwent a deceased donor renal transplant here at the [facility] on September 01, 2005. She has done well since her transplant. She does have significant anemia and a couple of weeks ago she had a urinary tract infection with Enterococcus. That was treated successfully with nitrofurantoin. She told me that she received three doses of Venofer IV and she is still on EPO replacement.    On review of systems, she otherwise feels fine but does get fatigued quite easily. She has no chest pain, no headaches, no tremors. She claims that her blood sugars have been between 80 and 130. She checks her blood sugars twice daily. She walks with a walker. She is sleeping okay. She has no problems voiding. She denies fevers and chills. The remainder of the 14-point review of systems was negative.    Medications:  CellCept 500 mg twice daily,  Prograf 5 mg twice daily,  Epogen 15,000 units twice weekly,  Plendil 5 mg daily,  labetalol 400 mg twice daily,  dapsone 100 mg daily,  lovastatin 20 mg daily,  magnesium 400 mg twice daily,  Colace 100 mg twice daily,  Valcyte 450 mg twice daily,  multivitamins once daily,  aspirin 81 mg daily,  K-Phos Neutral 500 mg twice daily.    The patient has multiple allergies including penicillin, codeine, iodine, azithromycin, sulfa, Vicodin.    Physical examination reveals an elderly woman who is 63 inches tall. Pulse was 62, blood pressure 145/69, respiratory rate 18, temperature 36.7 degrees C, weight 77.8 kg. No acute distress, well dressed, well groomed.  HEENT: no cushingoid facies. Pupils equal, round and reactive to light. Extra ocular  movements were intact. No oral candidiasis or herpetic lesions. No jaundice. Bilat hearing aids.  NECK: supple, no palpable lymph nodes or goiter, no carotid bruits were heard. No jugular vein distention.  LUNGS: clear to auscultation and percussion.  HEART: regular rhythm, no S3 or S4.  GI ABDOMEN: soft, non-tender with positive bowel sounds. No bruit heard over the kidney allograft.  BACK: no CVA tenderness.  EXTREMITIES: no cyanosis, clubbing, 1+ pitting edema of legs, bilateraly.  SKIN: no rashes.  LYMPHONODES: none palpable.  NEUROLOGIC: oriented x3, no significant tremors, and no focal signs. PSYCH: denied depression.      Laboratory data from October 19, 2005 show a BUN of 15, creatinine 1.2, blood sugar 81, phosphorus 2.1, calcium 9.6. White cell count is 3.9, hematocrit 25%, platelet count 286,000. Urinalysis shows no protein, leukocyte esterase and nitrates negative.    ASSESSMENT:    1. Renal transplant. Creatinine is stable. I will see her again in two weeks. She should continue to have labs at least once weekly.      2. Anemia. As mentioned above, she just completed the three doses of Venofer. I will restart her oral iron and we will continue to monitor her red cell counts. If the hematocrit fails to improve, we will then proceed with rechecking her iron stores.    3. Diabetes mellitus, controlled just with diet. Her blood sugar has been outstanding even without any medication for diabetes.  4. Hypophosphatemia, well controlled on phosphorus replacement.    5. Hypertension, with acceptable control at this time considering her age. The hope is once her hematocrit improves, she will be more active physically.    6. Dyslipidemia, stable on lovastatin. Her last cholesterol was 149 and her last triglycerides 84 (on October 19, 2005).    7. Urinary tract infection. Her urinalysis is normal today. She does not have any symptoms.    8. Prophylaxis. She should continue the dapsone and the Valcyte for  now.    9. Hypomagnesemia. She was started on magnesium, I think the last time she was seen in clinic. Her last magnesium level was 1.6 on October 19, 2005, which is better than before. She should continue the magnesium tablets.    10. Lower extremity edema. She should go back to taking Lasix 20 mg every other day and she is willing to do that.        THIS WAS ELECTRONICALLY SIGNED - 10/26/2005 4:49 PM PST BY: Frazier Butt, MD  ATTENDING  DIVISION OF NEPHROLOGY AND TRANSPLANT  DEPARTMENT OF INTERNAL MEDICINE        XLK:[id number])    D: 10/21/2005 11:27 AM  T: 10/26/2005 01:25 PM  C#: [id number]    cc:   Lonna Duval, MD  [facility]  [address], [address]  [place], [place] [address]    Hope Budds, MD  [facility]   [address]   [place], [place] [address]    Marily Memos, MD  [facility]  [address]  [place], [place] [address]

## 2005-11-04 ENCOUNTER — Encounter: Admitting: Nephrology

## 2005-11-06 NOTE — Progress Notes (Addendum)
PATIENT: PAILYNN, VAHEY   MR #: [id number] SEX: F AGE: 72  DATE OF SERVICE: 11/04/2005 DOB: 1925-11-20    TRANSPLANT CENTER CLINIC NOTE    Today I saw Ms. Pixley for the followup of her kidney transplant. She is 72 years of age. She has end-stage renal disease attributed to diabetes. She underwent a deceased donor renal transplant here at [facility] back in September 01, 2005. She has done well since her transplant despite her age. She is here today for followup appointment, offering no major complaints. She did state, though, that her caregiver was involved in a car accident. Thus, she is not there to help her with her EPO shots. Mrs. Kishbaugh stated that last night she was a little bit confused that if she had indeed taken her medication or not, so she ended up taking her evening dose at 11 p.m. rather than at 7 p.m., but however she did take the right doses just a little bit late. She feels stronger by the day. She is back driving. She has no complaints today. She is quite independent, and she is proud of that. She does not want to bother her kids in terms of having another person to take care of her. She does use the walker, though mostly for balance.    REVIEW OF SYSTEMS:    She is holding her CellCept because of leukopenia. Her EPO dose has been increased because of anemia. She denies any constipation, diarrhea. No shortness of breath. No chest pains. She denies any worsening edema of her lower extremities. She denies any skin rashes. The other systems reviewed were negative.    Allergies to codeine, penicillin, and Talwin.    MEDICATIONS ARE:    1. Prograf 5 mg twice daily.  2. CellCept 250 mg b.i.d. (holding x1 week due to leukopenia). 3. Dapsone 100 mg daily.  4. Valcyte 450 mg b.i.d.  5. Aspirin 81 mg daily.  6. Pepcid 20 mg daily.  7. Colace 100 mg b.i.d.  8. Multivitamins once daily.  9. Labetalol 400 mg b.i.d.  10. Plendil 5 mg daily.  11. EPO 20,000 units twice weekly.  12. Lovastatin 20 mg  daily.  13. Magnesium 300 mg daily.  14. Iron 65 mg b.i.d.  15. K-Phos neutral 500 mg daily.    PHYSICAL EXAMINATION:    Reveals a 63 inches tall African American woman who weighs 76.3 kg, blood pressure 148/56. Pulse was 72 and regular. Temperature is 37 degrees. No acute distress, well dressed, well groomed. EYES: Pupils equal, round and reactive to light. Extra ocular movements were intact. No jaundice.  ENMT: No oral candidiasis or herpetic lesions. Clear oropharynx. No lesions in the nose or ears noted.  RESPIRATORY: lungs clear to auscultation and percussion. CARDIOVASCULAR: heart with regular rhythm, no S3 or S4. No carotid bruits were heard. No jugular vein distention. Patient has 1+ pitting edema of the legs bilaterally.  GI: abdomen soft, non-tender with positive bowel sounds. No hepato- splenomegaly felt.  GU: no bruit heard over the kidney allograft.  MUSCULOSKELETAL: no CVA tenderness. Patient ambulates with help of walker.  HEME/LYMPH: no palpable lymph nodes.  SKIN: no rashes.  NEUROLOGIC: oriented x3, no significant tremors.  PSYCH: appropriate affect, denied depression.      Labs from today show a BUN of 14, creatinine of 1.0, glucose of 88, potassium 4.4, white cell count of 2100, hematocrit of 27%, platelet count of 257, total cholesterol 159, triglycerides 61, calcium 9.6, phosphorus  2.6, magnesium 1.6. Urinalysis showed trace protein, no cells.    ASSESSMENT:    1. Renal transplant, excellent creatinine. I will see her again in three weeks. She should continue to have labs once a week. 2. Leukopenia. We will keep checking her white cell count once a week, and as soon as her white cell count is above 3000 we would restart her CellCept at 250 twice daily.  3. Anemia. Her hematocrit has been improved slightly. She should continue the EPO injections.  4. Viral prophylaxis. At this point as she has been a couple of months after transplant, I will decrease her Valcyte to 450 mg once daily. That might  be contributing to her leukopenia. 5. Dyslipidemia. Excellent control on lovastatin. 6. Diabetes mellitus, well controlled just with diet.    I will see her again in three weeks or sooner if her condition dictates.        THIS WAS ELECTRONICALLY SIGNED - 11/06/2005 1:51 PM PST BY: Frazier Butt, MD  ATTENDING  DEPARTMENT OF INTERNAL MEDICINE                  YNW:[id number])    D: 11/04/2005 02:13 PM  T: 11/06/2005 10:40 AM  C#: [id number]    cc:   Nanetta Batty, MD  [facility]  [address]  , [place] [address]    Hope Budds, MD  [facility]  [address]  [place], [place] [address]

## 2005-11-18 ENCOUNTER — Encounter

## 2005-11-25 ENCOUNTER — Encounter: Admitting: Specialist

## 2005-11-28 NOTE — Progress Notes (Addendum)
PATIENT: Cathy Hunter, SZYMBORSKI LOCATIONJules Schick   MR #: [id number] SEX: F AGE: 72  DATE OF SERVICE: 11/18/2005 DOB: 07-17-1925    TRANSPLANT CENTER CLINIC NOTE    SUBJECTIVE: The patient is a 72 year old African-American female status post deceased donor domino renal transplant on August 28. She is generally feeling well. Last week felodipine was discontinued for lower extremity edema. This has improved significantly. She was begun on lisinopril. Her blood pressures have been well controlled at home, 140/50-60. She has had one episode of wheezing the past week, which she said improved with Atrovent nebulizer. She has not had this nebulizer at all in the last several months and she has used it on only one occasion. Generally, her activity level has improved, and patient continues to do well. Has increased energy, increased appetite and increased activity.    MEDICATIONS: Her medications now include Prograf 5 mg p.o. b.i.d., Lasix 20 p.o. p.r.n. every two weeks for swelling, iron sulfate 65 b.i.d., lisinopril 10 QD, magnesium 300 QD, K-phos 500 b.i.d., Pepcid p.r.n., Dyspepsia, Epogen 20,000 units two times a week, labetalol 400 b.i.d., dapsone 100 QD, Colace 100 b.i.d., multivitamins one QD, aspirin 81 QD, lovastatin 20 QD.    PHYSICAL EXAM: Elderly African-American woman in no acute distress. Weight is 78.2 kilos, which is about 2 kilos from two weeks ago, temperature 37.2, blood pressure is 143/67, HEENT: normal. LUNGS: Clear without rales or wheezing. ABDOMEN: Soft, nontender. EXTREMITIES: 2+ edema both ankles.    PERTINENT LABORATORY STUDIES: BUN, creatinine 15 and 1.2, potassium 4.2, phosphorous 2.7, magnesium 1.5. Hematocrit is 30, Prograf level is 7.    ASSESSMENT: A 72 year old African-American woman doing well, status post deceased donor renal transplant. Problem list includes:    1. Renal function stable with a creatinine of 1.2. It went as low as 1.0.  2. Anemia. Hematocrit is 30, improved from low of 24. Will  continue erythropoietin and iron.  3. Decreased magnesium 1.5, stable. Continue supplementation. 4. Hypophosphatemia, stable. Continue supplementation. 5. Hypertension, stable after switching from felodipine to lisinopril.  6. Neutropenia, slightly improved off of CellCept. Will continue to hold.  7. Immunosuppression. Continue with 5 b.i.d. Level is 7, which should be adequate.        THIS WAS ELECTRONICALLY SIGNED - 11/28/2005 4:40 PM PST BY: Vernice Jefferson, MD  PROFESSOR AND CHIEF  DIVISION OF TRANSPLANT SURGERY  DEPARTMENT OF SURGERY            AVW:[id number])    D: 11/18/2005 11:28 AM  T: 11/21/2005 04:06 PM  C#: [id number]      cc:   Burman Blacksmith, MD   JEFFREY Roseanne Reno, MD, Trevor Mace

## 2005-12-08 ENCOUNTER — Encounter: Admitting: Specialist

## 2005-12-11 NOTE — Progress Notes (Addendum)
PATIENT: Cathy, Hunter LOCATIONJules Schick   MR #: [id number] SEX: F AGE: 72  DATE OF SERVICE: 11/25/2005 DOB: 05/15/1925    TRANSPLANT CENTER CLINIC NOTE    The patient is seen and examined by me in transplant clinic for FU.    SUBJECTIVE: Cathy Hunter is a 72 year old female with stage V chronic kidney disease, who received a deceased donor kidney transplant September 01, 2005. She will complete three months pretty soon. Her CellCept and Valcyte have been held secondary to significant leukopenia, and the leukopenia has improved from 2.1 to 3.3 thousand.    Overall she feels well. No fever. No chills. No chest pain or shortness of breath. No orthopnea or PND. No dysuria or hematuria. Bowel movements are good. Stable lower extremity edema persists. Spends time shopping in the mall. Appetite is great. No nausea, vomiting or heartburn.    PHYSICAL EXAM: Weight 75 kilos, temperature 37, pulse 66, blood pressure 140/54. HEENT: Unremarkable. Chest: Clear to auscultation. Cardiac exam: Normal. Abdomen is soft. A kidney transplant in the left lower quadrant is nontender. Extremity exam: Two plus peripheral edema.    IMMUNOSUPPRESSION: Prograf 5 b.i.d., CellCept 250 b.i.d.    LABORATORY DATA: Serum creatinine 1.2, potassium 4, blood glucose 96, serum phosphorus 3.4. White count 3.3, hematocrit 32.2, platelet count 233. Protein 2+. Prograf level 8.2.    ASSESSMENT:   1. Stable kidney allograft function.  2. Immunosuppression on dual therapy with Prograf and CellCept. I will not increase the CellCept any further. CellCept started at 250 b.i.d.  3. Lower extremity edema still persists. Increase Lasix to 20 mg p.o. q. day.  4. Hypertension: Well-controlled on lisinopril and labetalol. 5. Hypophosphatemia, corrected: Decrease K-Phos Neutral and discontinue it.  6. Anemia: Improved. Decrease Epogen to 20,000 units once a week. 7. Patient planning a trip to [place] in December. She will be seen the day before she departs. She is  getting laboratory data once a week.        THIS WAS ELECTRONICALLY SIGNED - 12/11/2005 8:55 AM PST BY: MEHUL Marlane Mingle, MD  ASSISTANT PROFESSOR  DIVISION OF NEPHROLOGY & TRANSPLANT  DEPARTMENT OF INTERNAL MEDICINE                  MVH:[id number])    D: 11/25/2005 09:33 AM  T: 12/03/2005 11:55 AM  C#: [id number]    cc:   Leda Gauze, MD, [address], [place], [place] [address]

## 2005-12-21 NOTE — Progress Notes (Addendum)
PATIENT: Cathy Hunter, Cathy Hunter   MR #: [id number] SEX: F AGE: 72  DATE OF SERVICE: 12/08/2005 DOB: [birth year]    TRANSPLANT CENTER CLINIC NOTE    HISTORY:    Cathy Hunter is a 72 year old female who received a deceased-donor kidney transplant on September 01, 2005. She had early acute renal failure but did not show acute rejection on transplant kidney biopsy.    Subsequently, her kidney function has improved and her creatinine currently remains at about 1.3 to 1.5 mg/dL.    Her hypertension is being treated with labetalol and lisinopril therapy. However, she has had persistent cough on lisinopril therapy and wonders if it is related to that.    She remains on a diuretic 20 mg, which she takes intermittently; I told her she probably needs to take it every day.    PHYSICAL EXAMINATION:    GENERAL: This is an elderly African American female in no apparent distress.  VITAL SIGNS: Weight is 72 kg, temperature 36, respiratory rate 18, pulse 68 per minute and blood pressure 140/74.  NECK: Mildly elevated jugular venous pressure.  CHEST: Clear to auscultation mainly; there is occasional expiratory wheezing present.  ABDOMEN: Obese but nontender.  GENITOURINARY: Kidney transplant is in the left lower quadrant, relatively superficial and nontender.  EXTREMITIES: There is 1+ to 2+ peripheral edema.    LABORATORY DATA:    Laboratory data from December 3 reveal a potassium of 4.5, BUN 21 and creatinine 1.2 mg/dL. Blood glucose is 108 mg/dL. Liver function tests are stable. Tacrolimus level is 7.8. White count is 3.6, hematocrit 32 and platelet count 217,000. Urinalysis shows 1+ protein.    ASSESSMENT AND PLAN:    1. Relatively stable kidney allograft function.    2. Immunosuppression, on dual therapy with tacrolimus and CellCept. No change is recommended at this time.    3. Anemia. Continue erythropoietin 20,000 units subcu once a week.    4. Hypertension. Continue labetalol at 400 b.i.d. Because of chronic cough, we will  discontinue the lisinopril and prescribe Cozaar 25 mg p.o. daily.    5. The patient plans a trip to [place] for approximately two weeks to visit her daughter. She will come back and obtain laboratory tests after that.    6. Peripheral edema. She should take Lasix 20 mg p.o. every day and not on an as-needed basis.    Return to clinic in two months.      THIS WAS ELECTRONICALLY SIGNED - 12/21/2005 11:44 AM PST BY: Desaree Downen Marlane Mingle, MD  ASSISTANT PROFESSOR  DIVISION OF NEPHROLOGY & TRANSPLANT  DEPARTMENT OF INTERNAL MEDICINE    AVW:[id number])    D: 12/09/2005 06:43 AM  T: 12/14/2005 03:38 PM  C#: [id number]    cc:   JEFFREY Doretha Sou, MD  [facility]   [address]

## 2006-01-28 ENCOUNTER — Emergency Department: Admit: 2006-01-28 | Attending: Emergency Medicine | Admitting: Emergency Medicine

## 2006-01-28 NOTE — Progress Notes (Signed)
PATIENT: Cathy Hunter, Cathy Hunter  MR #: [id number] SEX: F AGE: 73  DATE OF SERVICE: 01/28/2006 DOB: 09/17/1925      EMERGENCY DEPARTMENT NOTE    LINKING LANGUAGE:    I saw this patient with medical student [place], at [phone] in Area One of the Emergency Department. I have reviewed her notes and performed my own history and physical examination on the patient. Together we have formulated and executed the following care plan.    HISTORY OF PRESENT ILLNESS:    Cathy Hunter is a pleasant 73 year old female with a history of chronic kidney disease, status post renal transplant in late 2007, who presents to the Emergency Department with a chief complaint of dyspnea. She endorses a several-week history of gradually worsening fatigue and dyspnea on exertion. She states that she has had symptoms like this before and states that it was secondary to fluid on her lungs. She has a history of anemia and has been on Procrit, but has not been taking that recently secondary to intolerance of the frequent injections. She denies any bleeding.    She endorses a second problem of suprapubic discomfort and swelling. She notes slightly increased lower extremity edema and leg heaviness, but no frank calf pain or asymmetrical swelling. She denies any chest pain or fever. Her urine output has been stable. She denies orthopnea or PND.    PAST MEDICAL HISTORY:    Type 2 diabetes, CKD status post hemodialysis treatment followed by renal transplant with improvement in renal function, hypertension, hypercholesterolemia. PAST SURGICAL HISTORY: Kidney transplant in August 2007, total abdominal hysterectomy, right total hip arthroplasty, open cholecystectomy, arthroscopy, carpal tunnel release, multiple orthopaedic surgeries. DRUG ALLERGIES: PENICILLIN, CODEINE, TALWIN, IODINE, SULFA, AZITHROMYCIN, VICODIN. Medications: Include Prograf, CellCept, labetalol, lovastatin, vitamins, and Cozaar.    FAMILY HISTORY: Kidney disease.    SOCIAL HISTORY:    The  patient lives alone. She denies substance abuse.    REVIEW OF SYSTEMS:    A complete 14-point review of systems is elicited from the patient and positive for shortness of breath and suprapubic fullness. All other systems are reviewed in their entirety and negative.    PHYSICAL EXAMINATION:    IN GENERAL: Very pleasant, well-nourished, slightly overweight African-American female who appears her stated age. As she approaches the gurney from the waiting room, she is visibly dyspneic, speaking one to two-word sentences, but at rest she appears comfortable. VITAL SIGNS: Blood pressure 223/84, pulse 80, respirations 24, temperature 36.4. SKIN: Warm, dry, intact. HEENT: Normocephalic. Pupils are PERRLA. Conjunctivae are pale. Mucous membranes are moist. NECK: Supple. No obvious JVD with the patient sitting upright at 60 degrees. CHEST: Clear bilaterally, without wheezes, rhonchi, or rales. O2 saturation is 98% on room air while the patient is sitting comfortably. HEART: Regular rate and rhythm, without murmur, rub, or gallop. No clubbing or cyanosis. There is 1+ pitting edema in the lower extremities, up to the suprapubic and sacral regions. No calf tenderness. Homans sign is absent. Peripheral pulses are 2+ and symmetric. Evidence of two failed AV fistulas, one in each upper extremity. ABDOMEN: Soft, nontender, nondistended. BACK: No CVAT. MUSCULOSKELETAL: No bony tenderness or deformity. NEURO: Awake and alert.    ASSESSMENT AND PLAN:    Cathy Hunter presents for evaluation of dyspnea. Possible multiple contributing etiologies, including history of chronic renal failure with potential for volume overload, graft rejection, and acute renal failure. Additional considerations include new onset congestive heart failure, pneumonia, pleural effusions, and pulmonary embolus. The patient does  not have any risk factors for pulmonary embolism. Also consider acute coronary ischemia, though this would also be a very atypical  presentation.    We will check a chest x-ray, provide supplemental oxygen if needed, but there is no indication for that at this time. We will check basic labs, including CBC, chemistry panel, BNP, myoglobin, troponin, EKG, urinalysis.    EMERGENCY DEPARTMENT COURSE:    EKG showed sinus rhythm with LVH, rate of 66, normal axis intervals and ST segments. T-waves were slightly peaked, but not hyperacute. Chest x-ray demonstrated bilateral pleural effusions, mild cardiomegaly. Chemistry panel showed excellent renal function with a BUN of 15 and creatinine of 1.2. Potassium was normal at 4.1. Anion gap was normal. CBC showed a white blood cell count of 3.2, platelets of 249, and slightly improved HH of 9.1 and 28.1. BNP was markedly elevated at 1561, myoglobin and troponin were normal at 54 and 0.02 respectively. Hepatic panel indicates normal albumin at 3.5, normal hepatic function. Urinalysis demonstrates a protein level of 30, but no evidence of urinary tract infection.    IMPRESSION:    Thus, the patient's clinical picture is most consistent with acute volume overload causing elevated BNP and dyspnea. This is likely more right-sided heart failure give her increasing lower extremity edema and paucity of pulmonary findings. We will diurese the patient and discuss admission with Transplant Service versus Cardiology. While the patient's symptoms could be consistent with new onset congestive heart failure, I think given the patient's history of chronic kidney disease and renal transplant that her symptoms will be more compatible with a renal etiology.    DIAGNOSES:    1. Acute dyspnea.  2. Acute decompensated heart failure, possible new onset congestive heart failure.  3. Chronic renal insufficiency, status post renal transplant. 4. Stable normocytic anemia, secondary to chronic renal insufficiency and chronic kidney disease.  5. Bilateral pleural effusions.    ADDENDUM:    Patient managed by Mikael Spray. Discussed case with  EPRP who requested VQ scan to rule out PE. My pre-test prob was low and she had multiple other reasons to have dyspnea and an elevated BNP, but the scan was performed which was a low-intermediate prob scan. We will not anticoagulate her. She consents to transfer to [facility] system and is stable at the time of this dictation.          THIS WAS ELECTRONICALLY SIGNED - 01/28/2006 6:27 PM PST BY: Baron Hamper, MD  ATTENDING  EMERGENCY MEDICINE DEPARTMENT              ZO:[id number])    D: 01/28/2006 11:02 AM  T: 01/28/2006 11:05 AM  C#: [id number]

## 2006-01-28 NOTE — ED Notes (Signed)
Sob started this am,hx of kidney transplant,moderate distress,speaks 4-5 words,straight to area 1 bed 2.Marland KitchenMarland Kitchen

## 2006-02-09 ENCOUNTER — Encounter

## 2006-02-11 ENCOUNTER — Encounter: Payer: Self-pay | Admitting: Specialist

## 2006-02-16 ENCOUNTER — Encounter

## 2006-02-16 ENCOUNTER — Other Ambulatory Visit: Payer: Self-pay | Admitting: Specialist

## 2006-02-22 NOTE — Progress Notes (Addendum)
PATIENT: Cathy Hunter, Cathy Hunter   MR #: [id number] SEX: F AGE: 73  DATE OF SERVICE: 02/09/2006 DOB: 1925-02-16    TRANSPLANT CENTER CLINIC NOTE    Naomia Lenderman is a 73 year old female who received a deceased-donor kidney transplant on September 01, 2005. Graft function has been fairly good except for the fact that she has had problems with volume overload and was admitted to [facility] in January with a diagnosis of congestive heart failure. She was also noted to be hypertensive and was placed on Lasix 40 mg p.o. q. day.    She did not take her blood pressure medications this morning, but we found that the blood pressure is high. She feels well overall. She has no fever or chills at this time. She has no anorexia, nausea, or vomiting, although she has voluntarily lost some weight. There is no dysuria or hematuria. There is no voiding difficulty. There is no significant lower extremity edema at this time. She continues to do her physical activity to the best of her ability.    PHYSICAL EXAMINATION:    Weight 68.5 kg, temperature 37, respiratory rate 18, pulse 66, blood pressure 180/82. Neck: No obvious jugular venous distention. Chest: Clear to auscultation. Cardiac exam is remarkable for a 2/6 ejection systolic murmur. Abdomen is obese, but soft. Kidney transplant in the left lower quadrant. There is a loud allograft bruit over the left femoral artery. Extremity exam: No peripheral edema. Psychiatric: Appropriate mood and affect. Neurologic: Alert and oriented times three. No focal weakness.    IMMUNOSUPPRESSION:    Tacrolimus 4 mg in the morning and 3 mg in the evening, CellCept 250 mg p.o. b.i.d.    LABORATORY DATA:    From January 28, creatinine 1.7 mg/dL. Blood glucose 84 mg/dL. Tacrolimus level 9.9. White count 2.8, hematocrit 34, platelet count 242. Urinalysis shows trace protein.    ASSESSMENT:    1. Mildly impaired renal allograft function. Likely secondary to the effect of ACE inhibitor and diuretic  therapy to control her hypertension and congestive heart failure.    2. I am worried about the possibility of transplant renal artery stenosis because of the loud bruit over the left femoral artery as well as her hypertension and volume overload. We will arrange for a renal transplant ultrasound.    3. Hypertension. I will add nifedipine 30 mg p.o. twice a day to her blood pressure regimen. She is to continue all her other medications.    4. Chronic immunosuppression. On therapy with tacrolimus and CellCept. No changes made at this time.    Return to clinic in three months.    I have educated/instructed the patient regarding all aspects of the above stated plan of care. The patient indicated understanding.        THIS WAS ELECTRONICALLY SIGNED - 02/22/2006 10:48 AM PST BY: Brande Uncapher Marlane Mingle, MD  ASSISTANT PROFESSOR  DIVISION OF NEPHROLOGY & TRANSPLANT  DEPARTMENT OF INTERNAL MEDICINE                  AVW:[id number])    D: 02/10/2006 08:47 AM  T: 02/15/2006 05:31 PM  C#: [id number]    cc:   DR. Nanetta Batty, [address], [place], [place], [address]    Jiles Garter., MD, [address], [place], [place], [address]

## 2006-03-15 ENCOUNTER — Other Ambulatory Visit: Payer: Self-pay | Admitting: Specialist

## 2006-03-19 ENCOUNTER — Ambulatory Visit

## 2006-03-26 ENCOUNTER — Other Ambulatory Visit: Payer: Self-pay | Admitting: Specialist

## 2006-03-26 ENCOUNTER — Ambulatory Visit: Admit: 2006-03-26 | Discharge: 2006-03-26 | Attending: Body Imaging | Admitting: Body Imaging

## 2006-03-26 ENCOUNTER — Encounter

## 2006-03-26 MED ORDER — SODIUM BICARBONATE 8.4 % (1 MEQ/ML) INTRAVENOUS SYRINGE
1.0000 mL/kg/h | INJECTION | INTRAVENOUS | Status: DC
Start: 2006-03-26 — End: 2006-04-22
  Filled 2006-03-26: qty 150

## 2006-03-26 MED ORDER — SODIUM BICARBONATE 8.4 % (1 MEQ/ML) INTRAVENOUS SYRINGE
1.0000 mL/kg/h | INJECTION | INTRAVENOUS | Status: DC
Start: 2006-03-26 — End: 2006-03-26

## 2006-05-04 ENCOUNTER — Encounter: Payer: Self-pay | Admitting: Specialist

## 2006-05-04 ENCOUNTER — Encounter: Payer: Self-pay | Admitting: Nephrology

## 2006-06-02 ENCOUNTER — Encounter: Payer: Self-pay | Admitting: Nephrology

## 2006-06-08 ENCOUNTER — Ambulatory Visit: Admitting: Specialist

## 2006-06-08 VITALS — BP 162/68 | HR 62 | Temp 98.4°F | Wt 163.6 lb

## 2006-06-08 NOTE — Progress Notes (Addendum)
PATIENT: Cathy Hunter, Cathy Hunter   MR #: [id number] SEX: F AGE: 73  DATE OF SERVICE: 06/08/2006 DOB: [birth year]    TRANSPLANT CENTER CLINIC NOTE    Cathy Hunter is a 73 year old female who received a deceased-donor kidney transplant 09/01/05. Postoperative course was complicated by refractory hypertension, and the patient was found to have transplant renal artery stenosis and underwent angioplasty. She also had problems with volume overload and was admitted to [facility] in January. After correction of her renal artery stenosis, the patient has had no further problems with volume overload. She remains on chronic diuretic therapy.    SYSTEM REVIEW:    She feels well. No fever or chills. No chest pain or shortness of breath. No orthopnea or PND. No dysuria or hematuria. No significant lower extremity edema on diuretic therapy. She has occasional incontinence, but there is no voiding difficulty. There are no unusual skin rashes.    Home blood pressures are approximately 120 systolic and 70 diastolic. Blood pressure was slightly elevated in clinic today at 160 systolic.    PHYSICAL EXAMINATION:    GENERAL: Physical examination reveals an elderly African American female in no apparent distress.  VITAL SIGNS: Weight 74 kg, temperature 98, pulse 62 per minute, blood pressure 160/68.  NECK: Neck is supple without lymphadenopathy.  CHEST: Clear to auscultation.  CARDIAC: Cardiac exam normal. There is a II/VI ejection systolic murmur.  ABDOMEN: Abdomen is obese, but soft. Kidney transplant in the left lower quadrant is nontender. The left femoral pulse has a soft bruit. EXTREMITIES: No peripheral edema.  PSYCH: Appropriate mood and affect  NEURO: no focal weakness.    LABORATORY DATA:    Potassium 3.5, CO2 24, BUN 24, creatinine 1.4 mg/dL, blood glucose 454 mg/dL. Liver function tests are normal. White count 3, hematocrit 35.6, platelet count 201. Tacrolimus level 6. Urinalysis completely normal.    ASSESSMENT:    1.  Status post transplant and excellent kidney allograft function.    2. Chronic immunosuppression, on dual therapy with tacrolimus and low-dose CellCept. No changes made or recommended at this time.    3. Hypertension. Clinic systolic blood pressure is elevated. However, the patient tells me that home blood pressures are under much better control and she is not taking nifedipine anymore. I have asked her to monitor this more closely through her primary care physician and nephrologist.    4. Volume overload. Controlled on Lasix 80 mg p.o. per day.    5. No changes made in her immunosuppression.    6. No changes made in her antihypertensive medication.    7. Follow-up recommended through [facility] nephrologists. We shall see her back in six months' time. labs every month or two.        THIS WAS ELECTRONICALLY SIGNED - 06/08/2006 6:42 PM PST BY: Cynthea Zachman Marlane Mingle, MD  ASSISTANT PROFESSOR  DIVISION OF NEPHROLOGY & TRANSPLANT  DEPARTMENT OF INTERNAL MEDICINE    UJW:[id number])    D: 06/08/2006 02:04 PM  T: 06/08/2006 04:15 PM  C#: [id number]    cc:   Lonna Duval, MD  [facility]  [address]  [place], [place] [address]    Levonne Lapping, MD  [address]  [place], [place] [address]

## 2006-06-08 NOTE — Patient Instructions (Signed)
Blood tests once a month    FU Dr Roseanne Reno more regularly    No medication changes today

## 2006-07-28 ENCOUNTER — Encounter: Payer: Self-pay | Admitting: Nephrology

## 2006-08-31 ENCOUNTER — Encounter: Payer: Self-pay | Admitting: Nephrology

## 2006-09-02 ENCOUNTER — Telehealth: Payer: Self-pay | Admitting: Nephrology

## 2006-09-02 NOTE — Telephone Encounter (Signed)
Message left on answering machine requesting patient call back.  Pt is + UTI, I want to find out if her PCP has contacted her and started treatment.

## 2006-09-03 NOTE — Telephone Encounter (Signed)
Pt called back and denies s/s of UTI, she said she would contact her local [facility] PCP for follow up in this matter.

## 2006-10-05 ENCOUNTER — Encounter: Payer: Self-pay | Admitting: Nephrology

## 2006-11-09 ENCOUNTER — Encounter: Payer: Self-pay | Admitting: Nephrology

## 2006-12-07 ENCOUNTER — Encounter

## 2006-12-07 ENCOUNTER — Ambulatory Visit: Admitting: Nephrology

## 2006-12-07 ENCOUNTER — Ambulatory Visit

## 2006-12-07 NOTE — Progress Notes (Signed)
Cathy Hunter is a 73yr old female with stage V chronic kidney disease due to diabetic nephropathy who received domino deceased donor renal transplant on 09/01/05.  Post-transplant course has been complicated by refractory hypertension and angioplasty of renal transplant artery stenosis.  She returns today for routine follow up.     SUBJECTIVE:  Since her last visit, she has stopped exercising because she has not had transportation to group exercise programs.  She has gained a significant amount of weight because she has also been eating a lot.  She has been having dental procedures for fractured teeth after intubation for surgery.  Blood pressure ranges in the 120-130s with low diastolic pressures.  She reports a bite on her right arm a few days ago with rash and itching.  Blood sugars range in the low 100s.  She has developed a cough over night with production of clear sputum.      REVIEW OF SYSTEMS:  CONSTITUTIONAL:  No fevers, chills, sweats, malaise, anorexia or fatigue.    EYES:  Negative.    ENT:  Nasal congestion and cough which began last night.    PULMONARY:  Negative.    CARDIOVASCULAR:  Negative.    GI:  Negative.    GENITOURINARY:  Negative.    MUSCULOSKELETAL:  Persistent joint pain from arthritis in the shoulders, knees, and wrists.  It has been progressively worse.  She has not taken any analgesics for this.Marland Kitchen    SKIN:  Itching and rash in the right arm .    NEUROLOGIC:  Negative.      MEDICATIONS:   PRILOSEC PO 20 mg daily    FERROUS SULFATE PO one tablet daily    MAGNESIUM PO 300 mg daily    PROGRAF 1 MG CAP 3 mg PO BID    LASIX 80 MG TAB 80 mg PO QAM    LABETALOL 200 MG TAB 300 mg PO BID    MULTI-VITAMIN PO 1 tab daily    CELLCEPT 250 MG CAP 1 tabs twice daily    ASPIRIN 81 MG TAB 1 tablet daily       FAMILY HISTORY:  Reviewed and noncontributory.      SOCIAL HISTORY:  Reviewed and noncontributory.      PHYSICAL EXAMINATION:  GENERAL:  An elderly female in no acute distress.  She is  pleasant, alert and oriented to person, place and time.  She has no complaint of pain.  She has an intermittent cough.  VITAL SIGNS:    BP 124/49  Pulse 68  Temp (Src) 36.9 C (98.4 F) (Tympanic)  Ht 1.61 m (5' 3.39")  Wt 82.1 kg (181 lb)  LMP Hysterectomy  EYES:  Pupils equal, round, and reactive to light.  Sclerae are anicteric.     ENT:  Oropharynx is clear.  No pharyngeal erythema.  NECK:  No lymphadenopathy.  No jugular venous distention.   LUNGS:  Clear to auscultation.  HEART:  Regular rhythm.  There are S1, S2, no S3, S4, rub or gallop. 2/6 systolic murmur at the right upper sternal border  ABDOMEN:  Obese, soft, nontender and nondistended.  No mass, organomegaly, or bruit.  Normal bowel sounds.  The renal allograft is palpable without tenderness in the right lower quadrant.  There is no allograft bruit.    EXTREMITIES:  Trace ankle edema.  Intact peripheral pulses.    SKIN:  No rash.  NEUROLOGIC:  Cranial nerves III-XI are intact.  Motor strength is 5/5 throughout.  Sensory  intact to light touch.        LABORATORY DATA:  UREA NITROGEN, BLOOD (BUN) (no units)   Date Value   11/08/06 19       CREATININE BLOOD (no units)   Date Value   11/08/06 1.29       SODIUM (no units)   Date Value   11/08/06 145       POTASSIUM (no units)   Date Value   11/08/06 3.9       CHLORIDE (no units)   Date Value   11/08/06 111       CARBON DIOXIDE TOTAL (no units)   Date Value   11/08/06 25       CALCIUM (no units)   Date Value   11/08/06 9.7       ALKALINE PHOSPHATASE (ALP) (no units)   Date Value   11/08/06 85       GLUCOSE (no units)   Date Value   11/08/06 79       CHOLESTEROL (no units)   Date Value   11/08/06 259       TRIGLYCERIDE (no units)   Date Value   11/08/06 129       ASPARTATE TRANSAMINASE (AST) (no units)   Date Value   11/08/06 14       ALANINE TRANSFERASE (ALT) (no units)   Date Value   11/08/06 14       ALBUMIN (no units)   Date Value   11/08/06 4.0       WHITE BLOOD CELL COUNT (no units)   Date Value   11/08/06 3.4        HEMATOCRIT (no units)   Date Value   11/08/06 33.5       PLATELET COUNT (no units)   Date Value   11/08/06 222      Urinalysis negative on 11/08/06.  70 mg/dL protein on 16/10/96.    Tacrolimus 5.6 ng/mL      IMPRESSION AND PLAN:    1.  Status post deceased donor renal transplant.  She has stable allograft function without proteinuria until yesterday's urinalysis, which I suspect may be related to the developing upper respiratory tract infection.      2.  Immunosuppression.  Continue dual therapy with tacrolimus and MMF.  Tacrolimus trough level is a little higher than target of 5 ng/mL, but she has had quite a bit of variability in the levels over time, so we will make no change.  .    3.  Hypertension.  Blood pressure is well controlled, although the diastolic hypotension is a bit concerning.    4.  Hematologic.  Cell counts are stable.    5.  Electrolytes are within acceptable limits.    6.  Diabetes.  Blood sugars appear to be well controlled.  She asked about continued finger stick monitoring, and I have deferred this to Dr. Laveda Norman.    7.  Upper respiratory tract infection.  She has no shortness of breath and a clear lung examination.  I advised her to use OTC products for symptomatic relief such as robitussin DM and diphenhydramine.  If her symptoms worsen and she has shortness of breath, then she will need to be re-evaluated immediately to be sure that she is not developing a pneumonia.    8.  Obesity.  I counseled her to be careful about dietary indiscretion and to become more physically active again.  It appears that she has become quite sedentary and  she is likely eating due to boredom.    The patient was counseled about the above findings and recommendations and understood the discussion.  She will return to clinic in six months and will have laboratory tests monthly.

## 2006-12-07 NOTE — Nursing Note (Signed)
>>   MICHELLE L DENNIS     Tue Dec 07, 2006 12:04 PM  Vital signs taken, allergies verified, screened for pain, med hx taken (if appropriate).  Kinnie Scales, MA

## 2006-12-08 ENCOUNTER — Encounter: Payer: Self-pay | Admitting: Nephrology

## 2007-01-11 ENCOUNTER — Encounter: Payer: Self-pay | Admitting: Nephrology

## 2007-03-03 ENCOUNTER — Encounter: Payer: Self-pay | Admitting: Nephrology

## 2007-03-08 ENCOUNTER — Telehealth: Payer: Self-pay | Admitting: Specialist

## 2007-03-08 NOTE — Telephone Encounter (Signed)
Spoke with tp and told her that our md would like her to contact her PMD or her nephroglogist through [facility] regardin g her lab work.  She stated understanding.  Evangeline Dakin RN

## 2007-03-09 ENCOUNTER — Telehealth: Payer: Self-pay | Admitting: Specialist

## 2007-03-09 NOTE — Telephone Encounter (Signed)
Rn note: called patient to informed follow up with PCP or Nephrologist regards UTI and treament. Pt informed Mikael Spray has ordered antibiotic, she will pick up in am. And call us with medication ordered.Crisoforo Oxford RN

## 2007-03-09 NOTE — Telephone Encounter (Signed)
Result Notes message copied by Crisoforo Oxford on Wed Mar 09, 2007 4:47 PM  ------   Message from: Michaele Offer [facility]   Created: Mon Mar 07, 2007 10:03 AM    She needs to call PCP/nephrologist

## 2007-04-27 ENCOUNTER — Encounter: Payer: Self-pay | Admitting: Nephrology

## 2007-06-01 ENCOUNTER — Encounter: Payer: Self-pay | Admitting: Nephrology

## 2007-06-07 ENCOUNTER — Encounter

## 2007-07-05 ENCOUNTER — Ambulatory Visit: Admitting: Specialist

## 2007-07-05 VITALS — BP 134/64 | HR 87 | Temp 97.9°F | Wt 177.9 lb

## 2007-07-05 NOTE — Patient Instructions (Signed)
Labs every two months    Need to see primary care phyician on regular basis    Walking everyday  - try 30 min    No medication changes

## 2007-07-06 DIAGNOSIS — Z48298 Encounter for aftercare following other organ transplant: Secondary | ICD-10-CM | POA: Insufficient documentation

## 2007-07-06 DIAGNOSIS — I10 Essential (primary) hypertension: Secondary | ICD-10-CM | POA: Insufficient documentation

## 2007-07-06 DIAGNOSIS — I701 Atherosclerosis of renal artery: Secondary | ICD-10-CM | POA: Insufficient documentation

## 2007-07-06 DIAGNOSIS — Z5181 Encounter for therapeutic drug level monitoring: Secondary | ICD-10-CM | POA: Insufficient documentation

## 2007-07-06 DIAGNOSIS — E669 Obesity, unspecified: Secondary | ICD-10-CM | POA: Insufficient documentation

## 2007-07-12 NOTE — Progress Notes (Addendum)
PATIENT: Cathy Hunter, Cathy Hunter   MR #: [id number] SEX: F AGE: 74  DATE OF SERVICE: 07/05/2007 DOB: 1925/05/09    TRANSPLANT CENTER CLINIC NOTE    Ms. Zakyria Metzinger is a 74 year old female with stage V kidney disease, who received a deceased donor kidney transplant in August 2007. Postoperative course was complicated by refractory hypertension and the patient was found to have transplant renal artery stenosis and underwent angioplasty. She has not had any problems with volume overload or congestive heart failure since January 2008.    REVIEW OF SYSTEMS:    She went to the emergency room approximately one week ago for atypical chest discomfort. She was worried that she was having a heart attack. She was evaluated in the emergency room and sent home. She has had no other hospitalizations or surgeries this year. Feels well overall. No fever. No chills. No chest pain. No shortness of breath. Has gained a significant amount of weight after transplant. Exercise is decreased. Is not able to drive anymore because of difficulty with depth perception. No dysuria or hematuria. No voiding difficulty at this time. No diarrhea. No significant lower extremity edema. Home blood pressures are apparently under good control, although I do not have the readings.    PHYSICAL EXAMINATION:    GENERAL: Elderly African American woman in no apparent distress. VITAL SIGNS: Weight 80.7 kg, afebrile, pulse 87 per minute, blood pressure 134/64.  NECK: Supple, without jugular venous distension.  CHEST: Clear to auscultation.  CARDIAC: Exam reveals 2/6 ejection systolic murmur best heard in the aortic area.  ABDOMEN: Obese and soft. No organomegaly or masses. No tenderness. GENITOURINARY: Kidney transplant left lower quadrant. No tenderness elicited.  EXTREMITIES: Very trace peripheral edema.  NEURO: Alert and oriented times three. No focal weakness. PSYCH: Appropriate mood.    IMMUNOSUPPRESSION:    Tacrolimus 3 mg p.o. b.i.d., CellCept 250 mg  p.o. b.i.d.    LABORATORY DATA MAY 2009:    Potassium 3.9, BUN 22, creatinine 1.4 mg/dL. Blood glucose 88 mg/dL. Serum albumin 4.3 g/dL. Liver function tests are completely normal. Cholesterol is elevated at 250 mg/dL. White count 3.5, hematocrit 35.2, platelet count 234. Tacrolimus level is 6. Urinalysis shows 2+ protein, but at that time the patient had a urine infection, with 30 white cells per high power field and klebsiella in the urine.    ASSESSMENT:    1. Status post kidney transplant with stable and excellent kidney allograft function.    2. Chronic immunosuppression. On dual therapy with Tacrolimus and low-dose Mycophenolate mofetil. Keep Tacrolimus levels between 5 and 8 ng/mL. No changes made in the dose of Prograf or CellCept.    3. Hypertension. Blood pressures are under good control in clinic today.    4. Weight gain. This is the predominant problem over the last year. She needs to see a dietitian and also start a regular exercise program. She knows about this and she will try to keep her weight down.    5. Volume overload. Controlled on Lasix 80 mg per day.    6. Hyperlipidemia. I have not addressed this issue. It should be addressed by her [facility] physicians, if needed.    Return to clinic in six months.        THIS WAS ELECTRONICALLY SIGNED - 07/12/2007 10:53 AM PST BY: Carsyn Boster Marlane Mingle, MD  ASSISTANT PROFESSOR  DIVISION OF NEPHROLOGY & TRANSPLANT  DEPARTMENT OF INTERNAL MEDICINE      JYN:[id number])    D: 07/06/2007 07:25  AM  T: 07/11/2007 09:44 PM  C#: [id number]    cc:   Nanetta Batty, MD  [address]  [place], [place] [address]    Levonne Lapping, MD  [address]  [place], [place] [address]

## 2007-09-05 ENCOUNTER — Encounter: Payer: Self-pay | Admitting: Nephrology

## 2007-10-12 ENCOUNTER — Encounter: Payer: Self-pay | Admitting: Specialist

## 2007-11-23 ENCOUNTER — Encounter: Payer: Self-pay | Admitting: Nephrology

## 2007-11-24 ENCOUNTER — Telehealth: Payer: Self-pay | Admitting: Nephrology

## 2007-11-24 NOTE — Telephone Encounter (Signed)
Pt stated understanding of instructions. Geanie Pacifico RN

## 2007-11-30 ENCOUNTER — Encounter: Payer: Self-pay | Admitting: Nephrology

## 2008-01-03 ENCOUNTER — Encounter: Payer: Self-pay | Admitting: Specialist

## 2008-01-04 ENCOUNTER — Ambulatory Visit

## 2008-01-04 ENCOUNTER — Ambulatory Visit: Admitting: Nephrology

## 2008-01-04 ENCOUNTER — Ambulatory Visit: Admitting: Specialist

## 2008-01-04 ENCOUNTER — Encounter

## 2008-01-04 NOTE — Nursing Note (Signed)
>>   Virgina Jock, RN     Thu Feb 23, 2008 11:39 AM  H1n1 order addended- patient was given injection with preservative. Initial order incorrect.    >> Virgina Jock, RN     Wed Jan 04, 2008  1:48 PM  Consent and information H1N1 handout given for patient to review and all questions were answered or directed to physician. Pt denied allergy to eggs and Guillain Barre syndrome and consented to vaccine. The H1N1 influenza vaccine was adminstered per protcocol. No reactions observed.

## 2008-01-11 NOTE — Progress Notes (Addendum)
PATIENT: Cathy, Hunter LOCATIONJules Schick   MR #: [id number] SEX: F AGE: 75  DATE OF SERVICE: 01/04/2008 DOB: Aug 22, 1925    TRANSPLANT CENTER CLINIC NOTE    Ms. Cathy Hunter is an 75 year old female with stage V kidney disease who received a deceased-donor kidney transplant in August 2007. Her postoperative course was complicated by refractory hypertension and pulmonary edema. The patient was found to have transplant renal-artery stenosis and underwent angioplasty with a successful result. She has not had any problems with volume overload or congestive heart failure since January 2008.    Her home blood pressures are well controlled, with an average blood pressure of 130 systolic and 70 diastolic. In clinic today, the systolic blood pressure is slightly further elevated, at 150.    REVIEW OF SYSTEMS:    She has had no emergency room visits. She has received the seasonal flu vaccine but not the H1N1 vaccine. No fevers or chills. No chest pain or shortness of breath. Physical activity is decreased. She has gained some weight. She has an in-home care provider who helps with housework. She has had two urinary tract infections this year, treated with ciprofloxacin.    PHYSICAL EXAMINATION:    GENERAL: An elderly African American woman in no apparent distress. VITAL SIGNS: Her weight is 82 kg (increased by 5 pounds compared to June 2009) and she is afebrile. Her pulse is 72 per minute and blood pressure 156/70.  NECK: Supple, without jugular venous distention.  CHEST: Clear to auscultation bilaterally.  CARDIAC: There is a soft 2/6 ejection systolic murmur. ABDOMEN: Obese but soft. There is a large right upper quadrant scar of cholecystectomy. There is no tenderness or organomegaly. GENITOURINARY: The kidney transplant in the left lower quadrant is nontender.  EXTREMITIES: She has no peripheral edema.  NEUROLOGIC: Alert and oriented times three. No focal weakness. PSYCHIATRIC: Appropriate mood and affect.    LABORATORY  DATA:    Laboratory data from January 02, 2008, reveal a BUN of 30, creatinine 1.45 mg/dL and blood glucose 95 mg/dL. Serum albumin is 3.8. Cholesterol is 230 mg/dL. White count is 3, hematocrit 32 and platelet count 211,000. Tacrolimus level is 5.3. Urinalysis shows 1+ protein. No real cellular elements are identified.    ASSESSMENT AND PLAN:    1. Status post deceased-donor kidney transplant, with stable graft function.    2. Immunosuppression, on dual therapy with tacrolimus and low-dose CellCept. No change is made at this time. The patient is elderly and I do not think we need to increase her mycophenolate mofetil any further.    3. Hypertension. She has an elevated systolic blood pressure in clinic today. She will monitor her blood pressure a bit more frequently and will call us if most of the systolic blood pressure readings are more than 140. We will increase the labetalol dose if they are.    4. Mild anemia and leukopenia, stable at this time.    5. No evidence of volume overload or congestive heart failure in the past.    6. Administer the H1N1 vaccine today.    7. Recommended walking for 20 to 30 minutes on a daily basis to improve physical activity and function.    She will return to clinic in six months.        THIS WAS ELECTRONICALLY SIGNED - 01/11/2008 1:52 PM PST BY: MEHUL Marlane Mingle, MD  ASSISTANT PROFESSOR  DIVISION OF NEPHROLOGY & TRANSPLANT  DEPARTMENT OF INTERNAL MEDICINE    AVW:UJW([id number])  D: 01/04/2008 02:09 PM  T: 01/11/2008 10:48 AM  C#: [id number]    cc:   Levonne Lapping, MD  [facility]  [address]  [place], [place] [address]    Lonna Duval, MD  [facility]   [address]  [place], [place] [address]

## 2008-02-03 ENCOUNTER — Encounter: Payer: Self-pay | Admitting: Nephrology

## 2008-02-03 MED FILL — influenza A (H1N1) vaccine 2009 15 mcg/0.5 mL intramuscular suspension: INTRAMUSCULAR | Qty: 0.05 | Status: AC

## 2008-02-23 NOTE — Progress Notes (Addendum)
Addended by: Virgina Jock on: 02/23/2008      Modules accepted: Orders

## 2008-04-03 ENCOUNTER — Encounter: Payer: Self-pay | Admitting: Nephrology

## 2008-05-01 ENCOUNTER — Encounter: Payer: Self-pay | Admitting: Nephrology

## 2008-06-26 ENCOUNTER — Encounter: Payer: Self-pay | Admitting: Nephrology

## 2008-07-04 ENCOUNTER — Ambulatory Visit: Admitting: Specialist

## 2008-07-04 VITALS — BP 123/51 | HR 74 | Temp 98.1°F | Wt 174.2 lb

## 2008-07-04 NOTE — Patient Instructions (Signed)
No medication changes    Labs once every 2-3 months    If recurrent UTI's, consider urology referral    MG

## 2008-07-13 NOTE — Progress Notes (Addendum)
PATIENT: Cathy Hunter, Cathy Hunter LOCATIONJules Schick   MR #: [id number] SEX: F AGE: 75  DATE OF SERVICE: 07/04/2008 DOB: Sep 20, 1925    TRANSPLANT CENTER CLINIC NOTE    Here in Transplant Clinic for follow-up today.    Her usual care is provided at [facility]. 75 year old female with stage V kidney disease who received a deceased-donor kidney transplant, August 2007. Postoperative course complicated by refractory hypertension and pulmonary edema. The patient was found to have transplant renal artery stenosis and underwent angioplasty with a successful result. She has not had any problems with volume overload or congestive heart failure since that time.    SYSTEM REVIEW:    Living independently here. Family is all over the country. Blood pressure control is fair. No hospitalizations or illnesses since her last visit. She has had one or two bladder infections that have been treated with oral antibiotics. No lower extremity edema. No dysuria or hematuria. No pain over the kidney allograft. No nausea or vomiting. No chest pain or shortness of breath. Is able to perform ADLs without any problem.    PHYSICAL EXAMINATION:    VITAL SIGNS: Weight 79 kg (decreased by 5 lbs compared to last visit). Pulse 74 per minute. Blood pressure 124/51. NECK: Neck is supple, without lymphadenopathy.  CHEST: Clear to auscultation bilaterally.  CARDIAC: II/VI ejection systolic murmur best heard at the base. ABDOMEN: Abdomen is obese, but soft. No organomegaly or masses. No tenderness.  GU: Kidney transplant, left lower quadrant. Nontender. EXTREMITIES: No peripheral edema.  NEUROLOGIC: Alert and oriented x 3. No focal weakness. PSYCHIATRIC: Appropriate mood and affect.    LABORATORY DATA:    Laboratory data from 06/25/08: White count 3, hematocrit 33, platelet count 218. Chemistries reveal BUN 21, creatinine 1.4 mg/dL. Blood glucose 103 mg/dL. Liver function tests completely normal. Tacrolimus level 6.5 ng/mL. Urinalysis showed an infection. This was treated  with antibiotics.    ASSESSMENT:    1. Almost three years status post deceased-donor kidney transplant. Kidney allograft function is stable.    2. Immunosuppression. On dual therapy with tacrolimus and low-dose mycophenolate mofetil. I have made no changes at this time. The patient does have mild pancytopenia which may be related to the use of mycophenolate mofetil.    3. Hypertension. Well controlled at this time on current dose of labetalol.    4. Volume overload. Controlled on Lasix 80 mg p.o. per day.    5. History of recurrent urinary tract infections. Mainly bladder infections without pyelonephritis. If this happens consistently, a one-time Urology referral may be considered for evaluation of the urinary bladder or consideration may be given to institution of prophylactic antibiotic therapy on a daily basis.    Return to Clinic: Six months.        THIS WAS ELECTRONICALLY SIGNED - 07/13/2008 11:22 AM PST BY: Jamyah Folk Marlane Mingle, MD  ASSISTANT PROFESSOR  DIVISION OF NEPHROLOGY & TRANSPLANT  DEPARTMENT OF INTERNAL MEDICINE      AVW:UJW([id number])    D: 07/06/2008 07:47 AM  T: 07/13/2008 10:35 AM  C#: [id number]    cc:   Keane Police, MD  [facility]  [address]  [place], [place] [address]    Jiles Garter., MD  [facility]   [address]   [place], [place] [address]

## 2008-08-29 ENCOUNTER — Encounter: Payer: Self-pay | Admitting: Nephrology

## 2008-10-02 ENCOUNTER — Encounter: Payer: Self-pay | Admitting: Nephrology

## 2008-11-27 ENCOUNTER — Encounter: Payer: Self-pay | Admitting: Nephrology

## 2008-12-03 ENCOUNTER — Telehealth: Payer: Self-pay | Admitting: Specialist

## 2008-12-03 NOTE — Telephone Encounter (Signed)
Pt called and stated since she started on generic Prograf she has not felt good. States she gets a hot flash feeling and feels badly and then the feeling goes away.  I advised her to call and go see her PCP immediately and to also notify [facility] pharmacy as weel right away and have labs again next week. She stated understanding. Evangeline Dakin RN

## 2008-12-10 IMAGING — CR DG CHEST 2V
1 series · 2 of 2 positions shown · non-contrast
Comparison: none

REASON FOR EXAM: hypoxia, rhonchi on Left
COMMENTS:

[Series 1: view not recorded · 0.17mm/px · 2 of 2 slices shown]
[im 1/2]
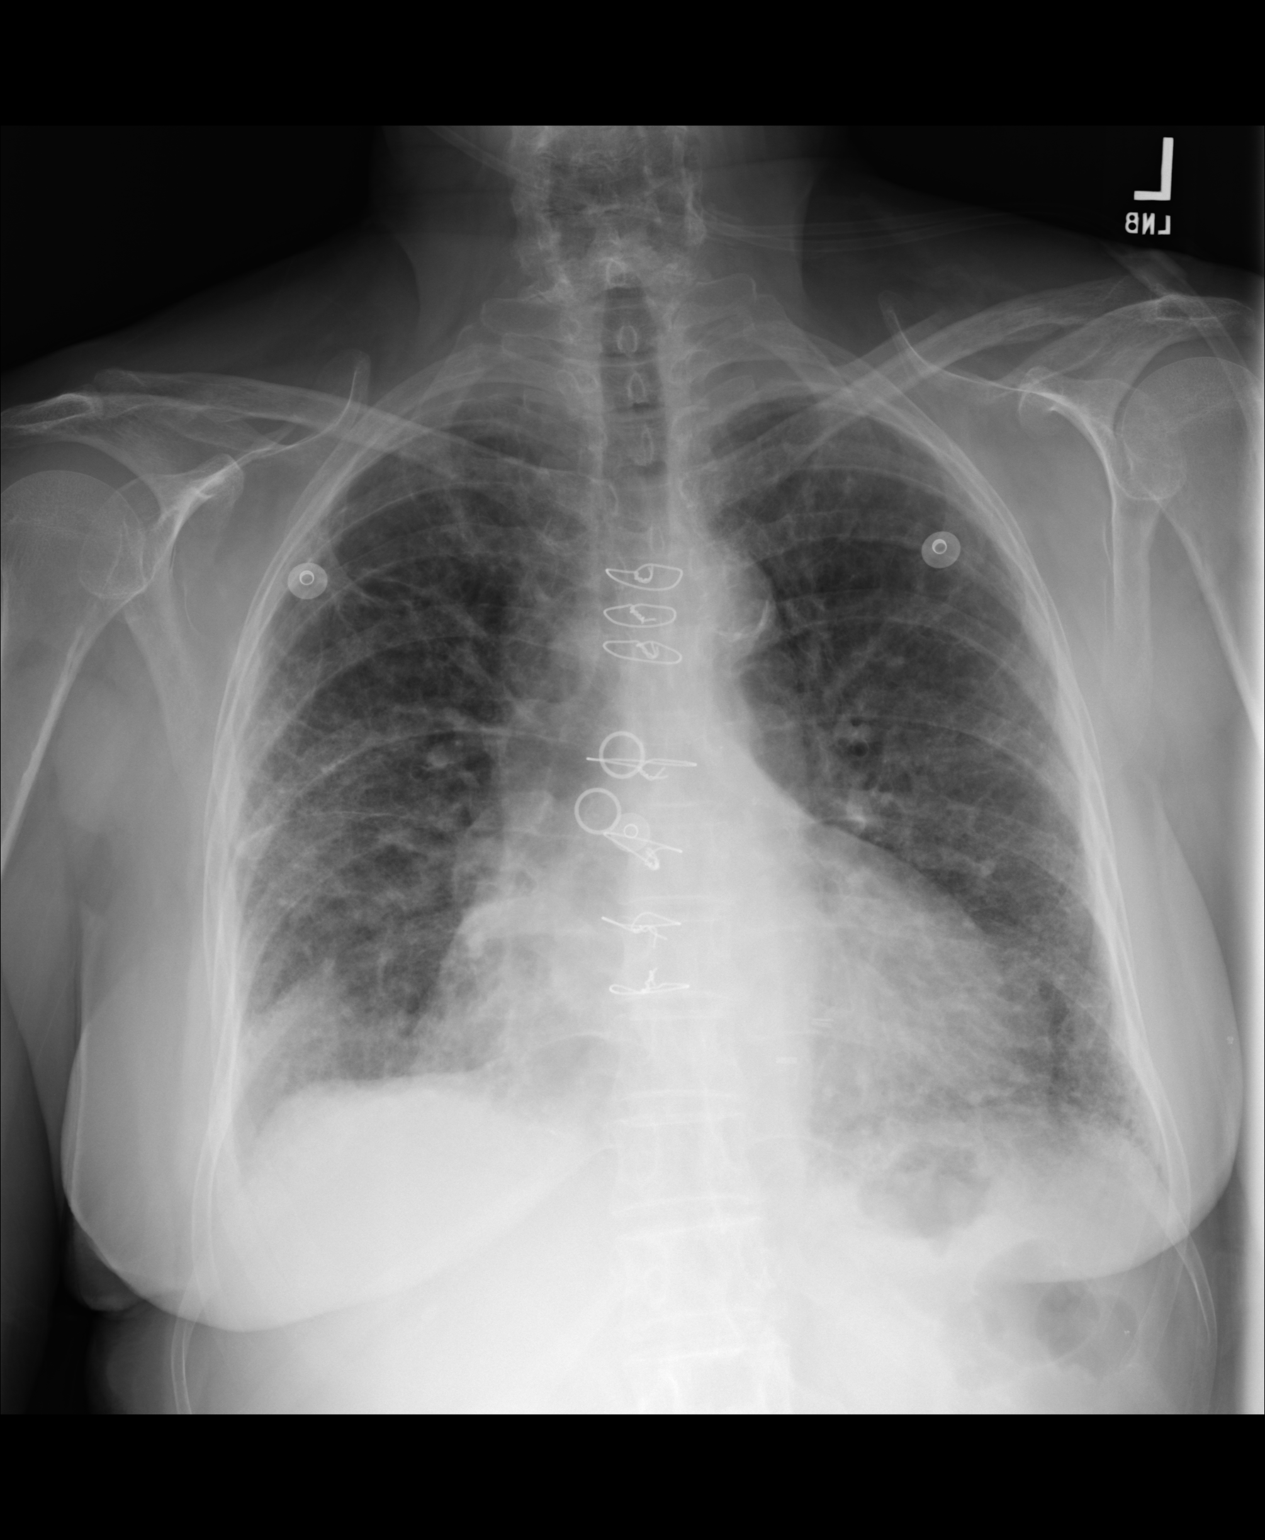
[im 2/2]
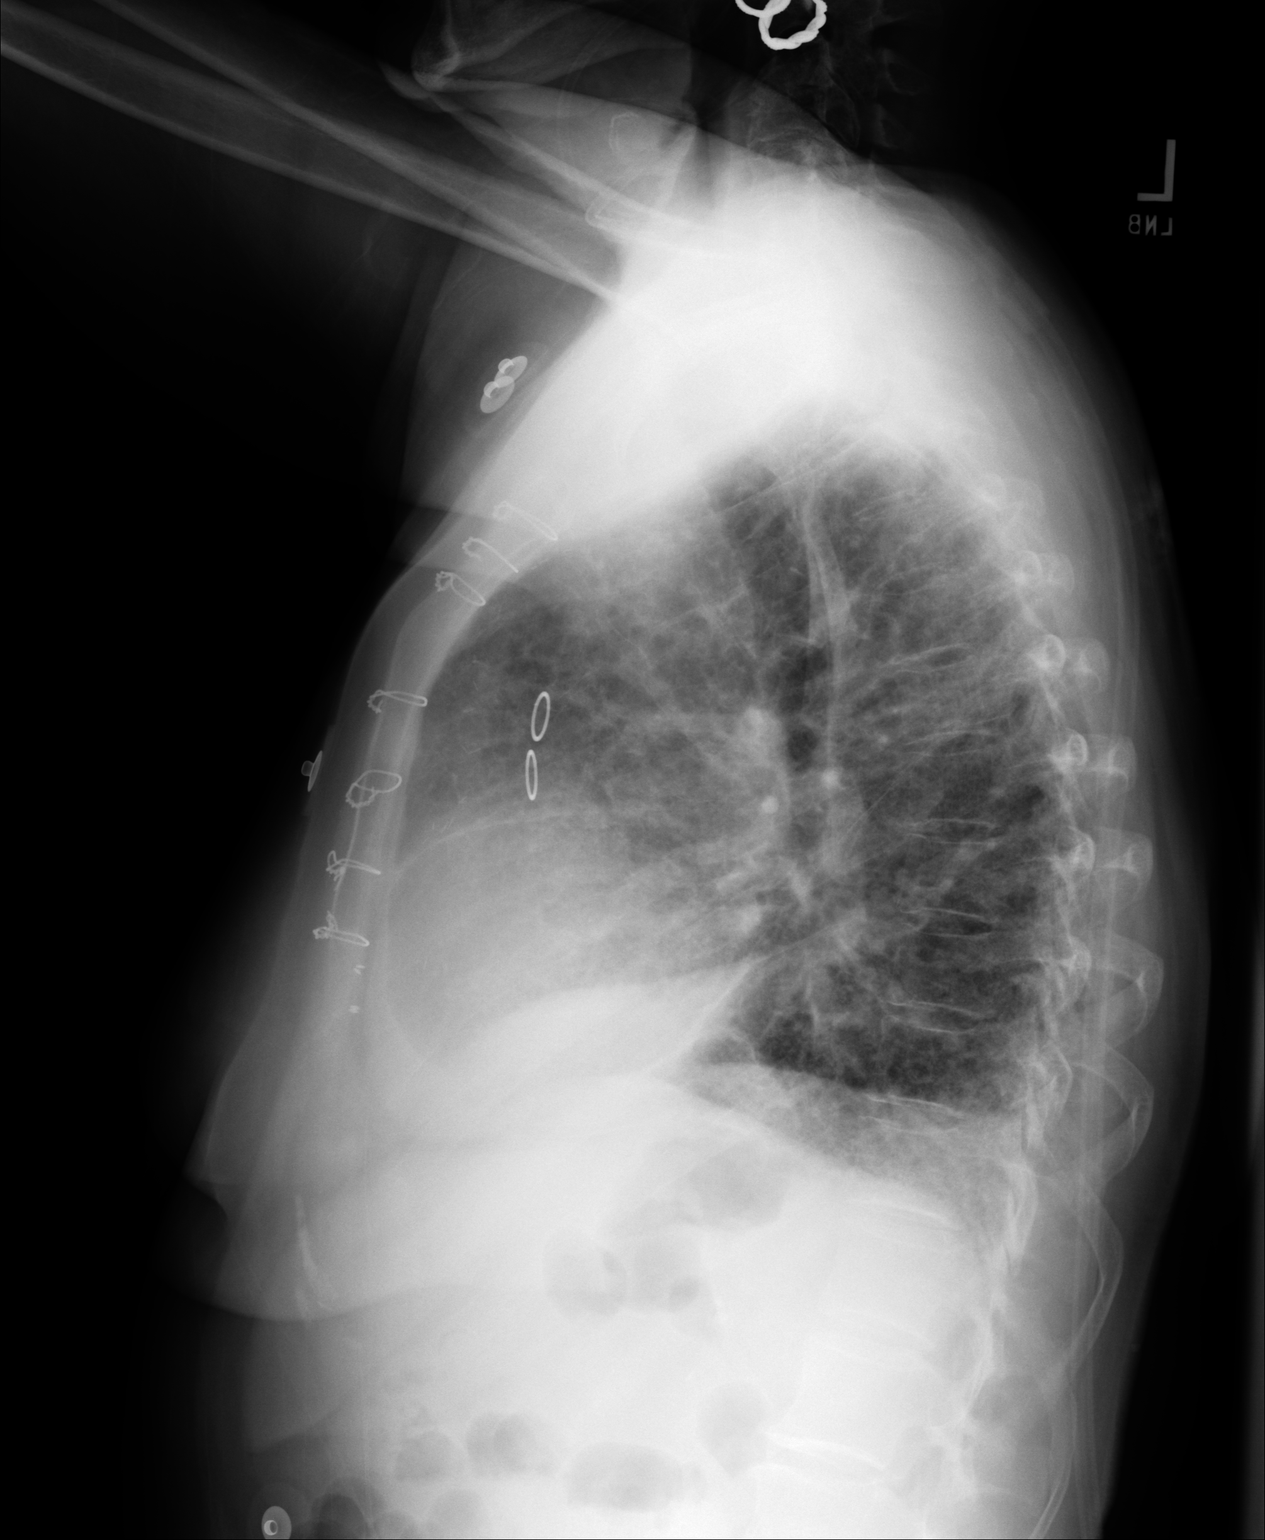

[2 of 2 positions shown; findings below may reference images not displayed]

PROCEDURE:     DXR - DXR CHEST PA (OR AP) AND LATERAL  - May 06, 2007  [DATE]

RESULT:     The lungs are adequately inflated. The interstitial markings are
increased. The cardiac silhouette is enlarged. The patient has undergone
prior CABG. There is likely pleural fluid in the major fissure on the RIGHT.
IMPRESSION: There are findings consistent with congestive heart failure. Followup films
following therapy would be of value.

## 2009-01-02 ENCOUNTER — Ambulatory Visit: Admitting: Nephrology

## 2009-01-02 ENCOUNTER — Encounter: Payer: Self-pay | Admitting: Nephrology

## 2009-01-02 NOTE — Progress Notes (Signed)
Cathy Hunter is a 75yr old female with stage V chronic kidney disease due to diabetic nephropathy who received expanded criteria deceased donor renal transplant on 09/01/05.  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  The stenosis was dilated and since then she has had no significant complications.  She returns today for routine follow up.     SUBJECTIVE:  Since her last visit, she has felt well.  She is frustrated that she cannot drive due to neck immobility which limits proper rear visibility.  She is continuing to be active in cooking and interacting with friends in her neighborhood.  She has not been very physically active, but she has changed her diet in order to lose weight.  She has a right cataract and is asking whether she should have it removed or not.  It apparently does affect her long distance vision    REVIEW OF SYSTEMS:  CONSTITUTIONAL:  No fevers, chills, sweats, malaise, anorexia or fatigue.    EYES:  Negative.    ENT:  Negative.    PULMONARY:  Negative.    CARDIOVASCULAR:  Negative.    GI:  Negative.    GENITOURINARY:  Negative.    MUSCULOSKELETAL:  Negative.    SKIN:  Negative.    NEUROLOGIC:  Negative.      MEDICATIONS:  Current outpatient prescriptions   Medication Sig    ASPIRIN 81 MG TAB 1 tablet daily    CELLCEPT 250 MG CAP 1 tabs twice daily    FERROUS SULFATE PO one tablet daily    LABETALOL 200 MG TAB 300 mg PO BID    LASIX 80 MG TAB 80 mg PO QAM    MAGNESIUM PO 300 mg daily    MULTI-VITAMIN PO 1 tab daily    PRILOSEC PO 20 mg daily    PROGRAF 1 MG CAP 3 mg PO BID         FAMILY HISTORY:  Reviewed and noncontributory.    SOCIAL HISTORY:  Reviewed and noncontributory.    PHYSICAL EXAMINATION:  GENERAL:  An elderly African American female in no acute distress.  She is pleasant, alert and oriented to person, place and time.  She has no complaint of pain.  VITAL SIGNS:    BP 117/53  Pulse 63   Temp(Src) 36.6 C (97.8 F) (Tympanic)  Ht 1.575 m (5\' 2" )  Wt 74.4 kg (164 lb 0.4 oz)  EYES:  Pupils equal, round, and reactive to light.  Sclerae are anicteric.     ENT:  Oropharynx is clear.  NECK:  No lymphadenopathy.  No jugular venous distention.   LUNGS:  Clear to auscultation.  HEART:  Regular rhythm.  There are S1, S2, no S3, S4, murmur, rub or gallop.   ABDOMEN:  Obese, soft, nontender and nondistended.  No mass, organomegaly, or bruit.  Normal bowel sounds.  The renal allograft is palpable without tenderness.  There is no allograft bruit.    EXTREMITIES:  No peripheral edema.  Intact peripheral pulses.    SKIN:  No rash.  NEUROLOGIC:  Cranial nerves III-XI are intact.  Motor strength is 5/5 throughout.  Sensory intact to light touch.        LABORATORY DATA:  UREA NITROGEN, BLOOD (BUN) (no units)   Date Value   11/26/2008 29       CREATININE BLOOD (no units)   Date Value   11/26/2008 1.53  SODIUM (no units)   Date Value   11/26/2008 142       POTASSIUM (no units)   Date Value   11/26/2008 4.5       CHLORIDE (no units)   Date Value   11/26/2008 107       CARBON DIOXIDE TOTAL (no units)   Date Value   11/26/2008 24       CALCIUM (no units)   Date Value   11/26/2008 9.7       PHOSPHORUS (PO4) (no units)   Date Value   11/26/2008 3.4       MAGNESIUM (MG) (no units)   Date Value   11/26/2008 1.9       ALKALINE PHOSPHATASE (ALP) (no units)   Date Value   11/26/2008 85       GLUCOSE (no units)   Date Value   11/26/2008 148       CHOLESTEROL (no units)   Date Value   11/26/2008 246       TRIGLYCERIDE (no units)   Date Value   11/26/2008 106       ASPARTATE TRANSAMINASE (AST) (no units)   Date Value   11/26/2008 12       ALANINE TRANSFERASE (ALT) (no units)   Date Value   11/26/2008 18       ALBUMIN (no units)   Date Value   11/26/2008 3.9       WHITE BLOOD CELL COUNT (no units)   Date Value   11/26/2008 3.4       HEMATOCRIT (no units)   Date Value   11/26/2008 33.4       PLATELET COUNT (no units)   Date  Value   11/26/2008 238      Urinalysis negative    Lab Results   Lab Name Value Date/Time    TACROLIMUS 4.8 11/26/2008  8:39 AM            IMPRESSION AND PLAN:  1.  Status post expanded criteria deceased donor renal transplant.  She has fairly stable allograft function without proteinuria and with good blood pressure control.  BUN and creatinine vary frequently, suggesting possibility of variable volume status.  She is also not careful about checking blood sugar, so she may have intermittend polyuria due to osmotic diuresis.      2.  Immunosuppression.  Continue MMF and tacrolimus.  Tacrolimus trough level is at target.    3.  Hypertension.  Blood pressure is well controlled.    4.  Hematologic.  Cell counts are stable.    5.  Electrolytes are within acceptable limits.      The patient was counseled about the above findings and recommendations and understood the discussion.  She will return to clinic in six months and will have laboratory tests every other month.    Electronically signed by   Rockey Situ, MD PhD  PI: [phone]  Pager: [phone]  Faculty  Division of Nephrology  Department of Internal Medicine

## 2009-01-02 NOTE — Nursing Note (Signed)
>>   Smitty Knudsen, MA     Wed Jan 02, 2009 12:55 PM  Vital signs obtained. Drug allergies verified. Screened for pain. Pharmacy preferences updated.  Smitty Knudsen MA

## 2009-02-27 ENCOUNTER — Encounter: Payer: Self-pay | Admitting: Specialist

## 2009-02-27 ENCOUNTER — Telehealth: Payer: Self-pay | Admitting: Specialist

## 2009-02-27 NOTE — Telephone Encounter (Signed)
PT stated understanding. Evangeline Dakin RN          Notes Recorded by Orie Fisherman, MD on 02/27/2009 at 11:09 AM  OK  Increase prograf slightly  From 3/3 to 4/3  MG  ------    Notes Recorded by Donna Bernard, RN on 02/27/2009 at 11:04 AM  WBC 2.4  Cellcept 250 bid  Dr. Duard Brady wants to hold her cellcept   Pt wants to confirm this is ok with [place]  Please advise

## 2009-04-03 ENCOUNTER — Encounter: Payer: Self-pay | Admitting: Specialist

## 2009-04-17 ENCOUNTER — Encounter: Payer: Self-pay | Admitting: Specialist

## 2009-05-29 ENCOUNTER — Encounter: Payer: Self-pay | Admitting: Nephrology

## 2009-06-12 ENCOUNTER — Ambulatory Visit

## 2009-06-14 ENCOUNTER — Encounter: Payer: Self-pay | Admitting: Nephrology

## 2009-06-26 ENCOUNTER — Ambulatory Visit

## 2009-06-26 VITALS — BP 127/55 | HR 75 | Temp 98.8°F | Wt 162.0 lb

## 2009-06-26 NOTE — Patient Instructions (Signed)
Urine test here today    No medication changes    MG

## 2009-07-01 NOTE — Progress Notes (Addendum)
PATIENT: Cathy Hunter, Cathy Hunter   MR #: [id number] SEX: F AGE: 76  DATE OF SERVICE: 06/26/2009 DOB: 09/29/1925    TRANSPLANT CENTER CLINIC NOTE    Cathy Hunter was seen in Transplant Clinic for follow-up today. An 76 year old African American female who received a deceased-donor kidney transplant in August 2007. Postoperative course complicated by refractory hypertension and pulmonary edema. The patient was found to have stenosis of the transplant renal artery and underwent angioplasty with successful result. She has not had any problems with hypertension or volume overload since that time.    SYSTEM REVIEW  Lives by herself in the [place] area. Getting older. Children are all over the country. May plan to move towards the [facility] area. Blood pressure control is good. Has reduced her diuretic from 80 mg a day to 40 mg a day at this time. She has had one or two bladder infections, treated with oral antibiotics. Mild lower extremity edema. No dysuria or hematuria. No pain over the kidney allograft.    Complains of weakness of the right hand that has been progressing over the past six months to one year. Difficulty with holding things in that hand and opening jars. Also has mild tremulousness on that side. Left arm strength is normal, without significant tremulousness.    PHYSICAL EXAMINATION:  GENERAL: Elderly African American female in no apparent distress. VITAL SIGNS: Weight 73.5 kg. Afebrile. Pulse 75 per minute. Blood pressure 127/55.  NECK: Neck supple, without lymphadenopathy.  CHEST: Clear to auscultation bilaterally.  CARDIAC: II/VI ejection systolic murmur best heard at the base. ABDOMEN: Abdomen is obese, but soft. No organomegaly or masses. No tenderness.  GU: Kidney transplant, left lower quadrant. Nontender. EXTREMITIES: Trace peripheral edema.    LABORATORY DATA:  White count 2.6, hematocrit 32.5, platelet count 218. Chemistries reveal BUN 21, creatinine 1.4 mg/dL. Blood glucose 105  mg/dL. Liver function tests are normal. Tacrolimus level 6.4 ng/mL. Urinalysis shows 5-15 white cells per high-power field. A protein-to-creatinine ratio is 0.4.    ASSESSMENT:    1. Status post deceased-donor kidney transplant with excellent kidney allograft function that remains stable.    2. Immunosuppression. Mycophenolate mofetil discontinued because of leukopenia. Remains on tacrolimus monotherapy. Aim for a tacrolimus level between 5-10 ng/mL.    3. Right arm weakness. Not related to antirejection medications. Do not know the details regarding this, but since this is progressive, consideration may be given to a Neurology evaluation if this has not already been done.    4. Hematologic cell counts reveal mild anemia and mild leukopenia.    5. Electrolytes are stable at this time, including potassium, bicarbonate, calcium and phosphorus.    Recommend laboratory testing every 2-3 months. I have made no changes in her immunosuppressive medications. The patient will return to clinic in 6-9 months.        THIS WAS ELECTRONICALLY SIGNED - 07/01/2009 3:35 PM PST BY: Alayziah Tangeman Marlane Mingle, MD  ASSISTANT PROFESSOR  DIVISION OF NEPHROLOGY & TRANSPLANT  DEPARTMENT OF INTERNAL MEDICINE      [id number])    D: 06/27/2009 01:45 PM  T: 07/01/2009 11:54 AM  C#: [id number]    cc:   Levonne Lapping, MD  [facility]  [address]  [place], [place] [address]    Jiles Garter., MD  [facility]   [address]   [place], [place] [address]

## 2009-07-03 ENCOUNTER — Encounter

## 2009-09-18 ENCOUNTER — Encounter: Payer: Self-pay | Admitting: Specialist

## 2009-11-27 ENCOUNTER — Encounter: Payer: Self-pay | Admitting: Specialist

## 2010-02-26 ENCOUNTER — Encounter

## 2010-03-12 NOTE — Procedures (Addendum)
PATIENT: Cathy Hunter, Cathy Hunter   MR #: [id number] SEX: F AGE: 77  OPERATION DATE: 07/14/2002 DOB: 1925/10/29    INPATIENT OPERATION RECORD    REFERRING PHYSICIAN: Roger Shelter, MD [facility])    PREOPERATIVE DIAGNOSIS:    Pancreatic head mass seen on CT scan, here for endoscopic ultrasound evaluation.    POSTOPERATIVE DIAGNOSES:    1. Head of the pancreas cystic versus ectatic pancreatic duct lesion measuring 2.6 x 1.3 cm.  2. No distinct solid component; however, some filling defect in the cystic component and irregular regions of pancreatic duct noted. 3. Status post fine-needle aspiration and decompression of the cystic space.  4. Erosive esophagitis and duodenitis noted.    PROCEDURE PERFORMED:    Endoscopic ultrasound with fine-needle aspiration with color Doppler. Subprocedure: Esophagogastroduodenoscopy.    MEDICATIONS GIVEN: Demerol 50 mg IV, Versed 4 mg IV, Benadryl 50 mg IV, ciprofloxacin 400 mg IV, Cetacaine pharyngeal spray.    INDICATIONS:    This is a 77 year old African American female who presented with abdominal pain and the CT scan revealed pancreatic head mass, here for endoscopic ultrasound evaluation.    PROCEDURE:    The procedure started at 1503 and ended at 1555. Informed consent was obtained from patient. The procedure was performed at [facility] at [facility] on an outpatient basis. After satisfactory administration of above-described medications, standard diagnostic endoscope GIF-Question-160 was introduced per her mouth. Scope was easily passed down the pharynx, esophagus, stomach and second portion of the duodenum. Visualized portion of the esophagus revealed significant erosive linear esophagitis covering the mid-to-distal esophagus. GE junction was noted at 36 cm from the incisors. The gastric mucosa was unremarkable. The erosion and shallow small ulcerations were noted in the duodenal mostly postbulbar regions. After visual inspection, insufflated air was  evacuated from the stomach and the scope was withdrawn. Then, ultrasound scope GFUM-20 was passed per mouth down the pharynx, esophagus, stomach, into second portion of the duodenum. The transduodenal view of the pancreatic head region revealed an anechoic lesion 2.6 x 1.3 cm in head region. There was a small quantity filing defect and the possibility of continuation to the pancreatic duct. The rest of the pancreatic duct was unremarkable, except in the head of pancreas portion there was some irregularity just proximal to the portion of the cystic appearing structure. There was no evidence of associated solid mass appearing surrounding tissue, however. There was no adjacent adenopathy. Extrahepatic bile duct was unremarkable and visualized portion of the liver was unremarkable. Transgastric view of the pancreatic body and tail was unremarkable. Left adrenal gland was unremarkable. There was no celiac adenopathy. Periluminal anatomy of the esophagus was otherwise unremarkable on inspection. Insufflated air and water was evacuated from the stomach. EUS balloon deflated. Scope was withdrawn. Then, a scope GSSUCD-140 was passed per mouth down into the duodenal bulb. Visualization of the anechoic structure was again identified and using a color Doppler to preclude the vascular puncture using 22 gauge Wilson-Cook ECHO tip needle fine-needle aspiration of anechoic space was performed. The puncture into the structure aspirated clear fluid, 3.5 cc in total, and this was sent for markers and cytopathology. Near-total decompression of this space was completed. After completion of the fine-needle aspiration, insufflated air was evacuated from the lumen and scope was withdrawn. The patient tolerated the procedure well and was transferred to Recovery Room in stable condition.    IMPRESSION:    Cystic structure in head of pancreas measuring 2.5 x 1.3 cm.  Cystic neoplasm versus other, including interductal papillary mucinous  neoplasm need to be considered.    RECOMMENDATIONS:    Follow up the marker and cytopathology result and patient should be given prophylaxis, ciprofloxacin X 5 days.                THIS WAS ELECTRONICALLY SIGNED - 07/26/2002 8:41 AM PST BY: Earlean Shawl, MD  SURGEON   DEPARTMENT OF INTERNAL MEDICINE      VQ:QV([id number])    D: 07/14/2002 02:25 PM  T: 07/14/2002 02:38 PM  C#: [id number]

## 2010-04-17 ENCOUNTER — Telehealth: Payer: Self-pay | Admitting: Nephrology

## 2010-04-17 NOTE — Telephone Encounter (Signed)
Message copied by Donna Bernard on Thu Apr 17, 2010 11:10 AM  ------       Message from: Donna Bernard       Created: Fri Feb 14, 2010  3:20 PM       Regarding: FW: Patient moved to [facility]: [phone]                       ----- Message -----          From: Waunita Schooner          Sent: 02/14/2010   9:36 AM            To: Tpcyp Post-Rn Kidney Result       Subject: Patient moved to [facility]                            Patient moved to [place] due to age and being closer to family memebers. She will be transferring her care to Dr. Brayton Mars E. Colindres, Nephrologist at [facility] [address], [place], [place] [address] [phone] ext [phone]. Patient's new address and phone number has been updated in transys and invision. Patient would like to say "I love you" to Dr. Newt Lukes and Dr. Carlynn Purl and that she loves all of [place] at [facility]. She doesn't want [place] to think that she abandoned [place] and she will be sending pictures in about a month when she learns how to use her phone to take pictures. Please contact patient at earliest convenience. Thank you.

## 2011-09-01 ENCOUNTER — Telehealth: Payer: Self-pay | Admitting: Nephrology

## 2011-09-01 NOTE — Telephone Encounter (Signed)
Message copied by Donna Bernard on Tue Sep 01, 2011  3:22 PM  ------       Message from: Murray Hodgkins       Created: Tue Sep 01, 2011  2:59 PM       Contact: [phone] (best) Patient calling states she received the "traveling       Kidney" 6 years ago today!  She wanted to let everyone know she is doing great and misses everyone.  She also states she has moved to [place] to be closer to family a year ago  [address] [place], [place] [address].    ------

## 2013-12-05 DEATH — deceased

## 2017-11-01 ENCOUNTER — Encounter: Payer: Self-pay | Admitting: Transplant Surgery

## 2017-11-05 DIAGNOSIS — N186 End stage renal disease: Secondary | ICD-10-CM | POA: Insufficient documentation

## 2017-11-06 NOTE — Committee Review (Signed)
12/05/2004   dgarcia   12/04/04 Per Committee-final presentation for listing.  Accepted to List11/03/2004   gbarker   Pt presented to committee on 11/06/04.  DM/Htn.  In '04 had work up for pancreas mass.  Bx negative.  Walks 5-6 blocks per day.  Retired Engineer, civil (consulting).  Good functional status.  EF 60%.  Per committee: Ok to CWE.  Get colon.
